# Patient Record
Sex: Female | Born: 1960
Health system: Southern US, Community
[De-identification: ages and names within clinical notes are randomized; demographics above are authoritative.]

## PROBLEM LIST (undated history)

## (undated) DIAGNOSIS — N9412 Deep dyspareunia: Secondary | ICD-10-CM

## (undated) DIAGNOSIS — R059 Cough, unspecified: Secondary | ICD-10-CM

## (undated) DIAGNOSIS — R42 Dizziness and giddiness: Secondary | ICD-10-CM

## (undated) DIAGNOSIS — G4700 Insomnia, unspecified: Secondary | ICD-10-CM

## (undated) DIAGNOSIS — R079 Chest pain, unspecified: Secondary | ICD-10-CM

## (undated) DIAGNOSIS — R05 Cough: Secondary | ICD-10-CM

## (undated) DIAGNOSIS — Z6825 Body mass index (BMI) 25.0-25.9, adult: Secondary | ICD-10-CM

## (undated) DIAGNOSIS — R002 Palpitations: Secondary | ICD-10-CM

## (undated) DIAGNOSIS — L309 Dermatitis, unspecified: Secondary | ICD-10-CM

## (undated) DIAGNOSIS — Z87891 Personal history of nicotine dependence: Secondary | ICD-10-CM

## (undated) DIAGNOSIS — R350 Frequency of micturition: Secondary | ICD-10-CM

## (undated) DIAGNOSIS — R030 Elevated blood-pressure reading, without diagnosis of hypertension: Secondary | ICD-10-CM

## (undated) DIAGNOSIS — R7989 Other specified abnormal findings of blood chemistry: Secondary | ICD-10-CM

## (undated) DIAGNOSIS — M8580 Other specified disorders of bone density and structure, unspecified site: Secondary | ICD-10-CM

## (undated) DIAGNOSIS — L509 Urticaria, unspecified: Secondary | ICD-10-CM

## (undated) DIAGNOSIS — R202 Paresthesia of skin: Secondary | ICD-10-CM

## (undated) DIAGNOSIS — M47812 Spondylosis without myelopathy or radiculopathy, cervical region: Secondary | ICD-10-CM

## (undated) HISTORY — DX: Chest pain, unspecified: R07.9

## (undated) HISTORY — PX: TONSILLECTOMY: SUR1361

## (undated) HISTORY — DX: Palpitations: R00.2

## (undated) HISTORY — DX: Other specified disorders of bone density and structure, unspecified site: M85.80

## (undated) HISTORY — DX: Insomnia, unspecified: G47.00

## (undated) HISTORY — DX: Dermatitis, unspecified: L30.9

## (undated) HISTORY — DX: Elevated blood-pressure reading, without diagnosis of hypertension: R03.0

## (undated) HISTORY — DX: Cough, unspecified: R05.9

## (undated) HISTORY — DX: Paresthesia of skin: R20.2

## (undated) HISTORY — DX: Frequency of micturition: R35.0

## (undated) HISTORY — DX: Body mass index (BMI) 25.0-25.9, adult: Z68.25

## (undated) HISTORY — DX: Personal history of nicotine dependence: Z87.891

## (undated) HISTORY — DX: Dizziness and giddiness: R42

## (undated) HISTORY — DX: Urticaria, unspecified: L50.9

## (undated) HISTORY — DX: Deep dyspareunia: N94.12

## (undated) HISTORY — DX: Other specified abnormal findings of blood chemistry: R79.89

## (undated) HISTORY — DX: Cough: R05

## (undated) HISTORY — DX: Spondylosis without myelopathy or radiculopathy, cervical region: M47.812

---

## 1999-09-02 ENCOUNTER — Other Ambulatory Visit: Admission: RE | Admit: 1999-09-02 | Discharge: 1999-09-02 | Payer: Self-pay | Admitting: Obstetrics and Gynecology

## 2000-11-14 ENCOUNTER — Other Ambulatory Visit: Admission: RE | Admit: 2000-11-14 | Discharge: 2000-11-14 | Payer: Self-pay | Admitting: Obstetrics and Gynecology

## 2002-04-08 ENCOUNTER — Other Ambulatory Visit: Admission: RE | Admit: 2002-04-08 | Discharge: 2002-04-08 | Payer: Self-pay | Admitting: Obstetrics and Gynecology

## 2003-07-23 ENCOUNTER — Other Ambulatory Visit: Admission: RE | Admit: 2003-07-23 | Discharge: 2003-07-23 | Payer: Self-pay | Admitting: Obstetrics and Gynecology

## 2004-10-10 ENCOUNTER — Other Ambulatory Visit: Admission: RE | Admit: 2004-10-10 | Discharge: 2004-10-10 | Payer: Self-pay | Admitting: Obstetrics and Gynecology

## 2005-09-13 ENCOUNTER — Encounter: Admission: RE | Admit: 2005-09-13 | Discharge: 2005-09-13 | Payer: Self-pay | Admitting: Family Medicine

## 2005-11-06 ENCOUNTER — Other Ambulatory Visit: Admission: RE | Admit: 2005-11-06 | Discharge: 2005-11-06 | Payer: Self-pay | Admitting: Obstetrics and Gynecology

## 2006-09-04 ENCOUNTER — Ambulatory Visit: Payer: Self-pay | Admitting: Family Medicine

## 2006-10-10 ENCOUNTER — Ambulatory Visit: Payer: Self-pay | Admitting: Family Medicine

## 2007-02-14 ENCOUNTER — Ambulatory Visit: Payer: Self-pay | Admitting: Family Medicine

## 2007-03-25 ENCOUNTER — Encounter: Admission: RE | Admit: 2007-03-25 | Discharge: 2007-03-25 | Payer: Self-pay | Admitting: Obstetrics and Gynecology

## 2008-08-05 ENCOUNTER — Ambulatory Visit: Payer: Self-pay | Admitting: Family Medicine

## 2008-08-21 ENCOUNTER — Ambulatory Visit: Payer: Self-pay | Admitting: Family Medicine

## 2009-02-19 ENCOUNTER — Emergency Department (HOSPITAL_COMMUNITY): Admission: EM | Admit: 2009-02-19 | Discharge: 2009-02-20 | Payer: Self-pay | Admitting: Emergency Medicine

## 2011-01-10 LAB — BASIC METABOLIC PANEL
BUN: 16 mg/dL (ref 6–23)
CO2: 26 mEq/L (ref 19–32)
Chloride: 108 mEq/L (ref 96–112)
Glucose, Bld: 106 mg/dL — ABNORMAL HIGH (ref 70–99)
Potassium: 4.5 mEq/L (ref 3.5–5.1)

## 2011-01-10 LAB — DIFFERENTIAL
Basophils Absolute: 0.1 10*3/uL (ref 0.0–0.1)
Eosinophils Absolute: 0.5 10*3/uL (ref 0.0–0.7)
Eosinophils Relative: 7 % — ABNORMAL HIGH (ref 0–5)
Monocytes Absolute: 0.7 10*3/uL (ref 0.1–1.0)

## 2011-01-10 LAB — CBC
HCT: 37.4 % (ref 36.0–46.0)
Hemoglobin: 13 g/dL (ref 12.0–15.0)
MCHC: 34.6 g/dL (ref 30.0–36.0)
MCV: 90.9 fL (ref 78.0–100.0)
Platelets: 197 10*3/uL (ref 150–400)
RDW: 13.1 % (ref 11.5–15.5)

## 2011-01-10 LAB — D-DIMER, QUANTITATIVE: D-Dimer, Quant: 0.23 ug/mL-FEU (ref 0.00–0.48)

## 2011-01-10 LAB — POCT CARDIAC MARKERS

## 2013-02-03 ENCOUNTER — Other Ambulatory Visit: Payer: Self-pay | Admitting: Family Medicine

## 2013-02-03 DIAGNOSIS — M542 Cervicalgia: Secondary | ICD-10-CM

## 2013-02-04 ENCOUNTER — Ambulatory Visit: Payer: BC Managed Care – PPO | Admitting: Diagnostic Neuroimaging

## 2013-02-05 ENCOUNTER — Ambulatory Visit
Admission: RE | Admit: 2013-02-05 | Discharge: 2013-02-05 | Disposition: A | Discharge status: Home / Self Care | Payer: BC Managed Care – PPO | Source: Ambulatory Visit | Attending: Family Medicine | Admitting: Family Medicine

## 2013-02-05 DIAGNOSIS — M542 Cervicalgia: Secondary | ICD-10-CM

## 2013-02-11 ENCOUNTER — Ambulatory Visit: Payer: BC Managed Care – PPO | Admitting: Diagnostic Neuroimaging

## 2014-01-19 ENCOUNTER — Other Ambulatory Visit: Payer: Self-pay | Admitting: Gastroenterology

## 2014-01-19 DIAGNOSIS — R1310 Dysphagia, unspecified: Secondary | ICD-10-CM

## 2014-01-22 ENCOUNTER — Ambulatory Visit
Admission: RE | Admit: 2014-01-22 | Discharge: 2014-01-22 | Disposition: A | Discharge status: Home / Self Care | Payer: BC Managed Care – PPO | Source: Ambulatory Visit | Attending: Gastroenterology | Admitting: Gastroenterology

## 2014-01-22 DIAGNOSIS — R1310 Dysphagia, unspecified: Secondary | ICD-10-CM

## 2016-02-29 DIAGNOSIS — H20022 Recurrent acute iridocyclitis, left eye: Secondary | ICD-10-CM | POA: Diagnosis not present

## 2016-03-31 ENCOUNTER — Encounter: Payer: Self-pay | Admitting: Family

## 2016-03-31 ENCOUNTER — Ambulatory Visit (INDEPENDENT_AMBULATORY_CARE_PROVIDER_SITE_OTHER): Payer: BLUE CROSS/BLUE SHIELD | Admitting: Family

## 2016-03-31 VITALS — BP 124/84 | HR 66 | Temp 98.1°F | Resp 16 | Ht 61.0 in | Wt 137.0 lb

## 2016-03-31 DIAGNOSIS — M25561 Pain in right knee: Secondary | ICD-10-CM

## 2016-03-31 DIAGNOSIS — M542 Cervicalgia: Secondary | ICD-10-CM | POA: Diagnosis not present

## 2016-03-31 MED ORDER — METHYLPREDNISOLONE ACETATE 40 MG/ML IJ SUSP
40.0000 mg | Freq: Once | INTRAMUSCULAR | Status: AC
  Administered 2016-03-31: 40 mg via INTRAMUSCULAR

## 2016-03-31 MED ORDER — KETOROLAC TROMETHAMINE 30 MG/ML IJ SOLN
30.0000 mg | Freq: Once | INTRAMUSCULAR | Status: AC
Start: 2016-03-31 — End: 2016-03-31
  Administered 2016-03-31: 30 mg via INTRAMUSCULAR

## 2016-03-31 MED ORDER — TIZANIDINE HCL 4 MG PO TABS: 4.0000 mg | ORAL_TABLET | Freq: Four times a day (QID) | ORAL | Status: DC | PRN

## 2016-03-31 MED ORDER — KETOROLAC TROMETHAMINE 30 MG/ML IJ SOLN
30.0000 mg | Freq: Once | INTRAMUSCULAR | Status: AC
  Administered 2016-03-31: 30 mg via INTRAMUSCULAR

## 2016-03-31 MED ORDER — NAPROXEN-ESOMEPRAZOLE 500-20 MG PO TBEC: 1.0000 | DELAYED_RELEASE_TABLET | Freq: Two times a day (BID) | ORAL | Status: DC | PRN

## 2016-03-31 MED ORDER — DICLOFENAC SODIUM 2 % TD SOLN: 1.0000 "application " | Freq: Two times a day (BID) | TRANSDERMAL | Status: DC | PRN

## 2016-03-31 NOTE — Progress Notes (Signed)
Subjective:    Patient ID: Veronica Booth, female    DOB: 1961/06/09, 55 y.o.   MRN: [id number]  Chief Complaint  Patient presents with  . Back Pain    has a bulging disc in her neck that is pressing on a nerve, had this issue for several years, has pain that runs from her neck all the way down her back, also having some pain and burning in her right knee     HPI:  Veronica Booth is a 55 y.o. female who  has no past medical history on file. and presents today for a Sports Medicine visit.  Neck and back pain - Previously diagnosed with cervical pain with MRI showing mild degenerative disease without central canal stensosis. There was moderate formainal narrowing at C4-C5 being worse on the right.Notes that in the last couple of months there is increased burning sensation that waxes and wanes. There is radiculopathy down the right arm with associated weakness in the right hand with decreased gripping at times  Modifying factors include anti-inflammatories, muscle relaxors and Lyrica which have helped to maintain her symptoms. Denies any trauma.  2.) Right knee pain - Associated symptom of burning pain located in her right knee has been waxing and waning for about 6 months. Denies any trauma. Previous noted x-ray of right knee with degenerative changes. Denies sounds/sensations heard or felt. No limited range of motion or changes to gait. There no modifying factors. No previous history of injury to the knee.   No Known Allergies   No outpatient prescriptions prior to visit.   No facility-administered medications prior to visit.     History reviewed. No pertinent past medical history.   Past Surgical History  Procedure Laterality Date  . Cesarean section       History reviewed. No pertinent family history.   Social History   Social History  . Marital Status: Married    Spouse Name: N/A  . Number of Children: N/A  . Years of Education: N/A   Occupational History  .  Not on file.   Social History Main Topics  . Smoking status: Former Research scientist (life sciences)  . Smokeless tobacco: Never Used  . Alcohol Use: No  . Drug Use: No  . Sexual Activity: Not on file   Other Topics Concern  . Not on file   Social History Narrative  . No narrative on file     Review of Systems  Constitutional: Negative for fever and chills.  Musculoskeletal: Positive for back pain, neck pain and neck stiffness.       Positive for right knee pain.  Neurological: Positive for weakness. Negative for numbness.      Objective:    BP 124/84 mmHg  Pulse 66  Temp(Src) 98.1 F (36.7 C) (Oral)  Resp 16  Ht 5\' 1"  (1.549 m)  Wt 137 lb (62.143 kg)  BMI 25.90 kg/m2  SpO2 96% Nursing note and vital signs reviewed.  Physical Exam  Constitutional: She is oriented to person, place, and time. She appears well-developed and well-nourished. No distress.  Neck:  Postural imbalance noted with no obvious discoloration or edema. Significant muscle spasm present on left upper trapezius. Range of motion is restricted in lateral bending bilaterally greater on the right than on the left. Cervical compression test is negative. Distal pulses and sensation are intact and appropriate in the right upper extremity.  Cardiovascular: Normal rate, regular rhythm, normal heart sounds and intact distal pulses.   Pulmonary/Chest: Effort normal  and breath sounds normal.  Musculoskeletal:  Right knee - no obvious deformity, discoloration, or edema noted. Palpable tenderness along bilateral joint lines with no other tenderness noted. Range of motion is within normal limits. Strength is 5+. Ligamentous and meniscal testing are negative. Distal pulses and sensation are intact and appropriate.  Neurological: She is alert and oriented to person, place, and time.  Skin: Skin is warm and dry.  Psychiatric: She has a normal mood and affect. Her behavior is normal. Judgment and thought content normal.       Assessment & Plan:    Problem List Items Addressed This Visit      Other   Cervical pain (neck) - Primary    Cervical neck pain most likely related to posture imbalances and previous MRI with moderate foraminal narrowing. Obtain cervical x-rays for alignment. In office injection of Depo-Medrol and Toradol provided. Start Pennsaid, Vimovo and tizanidine. Home exercise therapy and ice/heating regimen provided. Referral to physical therapy placed for postural exercises. Follow up in 3 weeks or sooner if needed.       Relevant Medications   Diclofenac Sodium (PENNSAID) 2 % SOLN   Naproxen-Esomeprazole 500-20 MG TBEC   ketorolac (TORADOL) 30 MG/ML injection 30 mg (Completed)   ketorolac (TORADOL) 30 MG/ML injection 30 mg (Completed)   methylPREDNISolone acetate (DEPO-MEDROL) injection 40 mg (Completed)   methylPREDNISolone acetate (DEPO-MEDROL) injection 40 mg (Completed)   Other Relevant Orders   DG Cervical Spine Complete   Ambulatory referral to Physical Therapy   Right knee pain    Knee with generalized tenderness and burning sensation in no ligamentous or meniscal pathology present upon exam. Question possible relation to low back pain versus actual knee pain. Treat conservatively with ice, Pennsaid, and Vimovo. If symptoms worsen or do not improve consider cortisone injection.       Relevant Medications   Diclofenac Sodium (PENNSAID) 2 % SOLN   Naproxen-Esomeprazole 500-20 MG TBEC   ketorolac (TORADOL) 30 MG/ML injection 30 mg (Completed)   ketorolac (TORADOL) 30 MG/ML injection 30 mg (Completed)   methylPREDNISolone acetate (DEPO-MEDROL) injection 40 mg (Completed)   methylPREDNISolone acetate (DEPO-MEDROL) injection 40 mg (Completed)       I am having Ms. Latka start on Diclofenac Sodium, Naproxen-Esomeprazole, and tiZANidine. We administered ketorolac, ketorolac, methylPREDNISolone acetate, and methylPREDNISolone acetate.   Meds ordered this encounter  Medications  . Diclofenac Sodium  (PENNSAID) 2 % SOLN    Sig: Place 1 application onto the skin 2 (two) times daily as needed.    Dispense:  112 g    Refill:  1    Order Specific Question:  Supervising Provider    Answer:  Pricilla Holm A [id number]  . Naproxen-Esomeprazole 500-20 MG TBEC    Sig: Take 1 tablet by mouth 2 (two) times daily as needed.    Dispense:  60 tablet    Refill:  0    Order Specific Question:  Supervising Provider    Answer:  Pricilla Holm A [id number]  . ketorolac (TORADOL) 30 MG/ML injection 30 mg    Sig:   . ketorolac (TORADOL) 30 MG/ML injection 30 mg    Sig:   . methylPREDNISolone acetate (DEPO-MEDROL) injection 40 mg    Sig:   . methylPREDNISolone acetate (DEPO-MEDROL) injection 40 mg    Sig:   . tiZANidine (ZANAFLEX) 4 MG tablet    Sig: Take 1-2 tablets (4-8 mg total) by mouth every 6 (six) hours as needed for muscle spasms.    Dispense:  40 tablet    Refill:  0     Follow-up: Return if symptoms worsen or fail to improve.  Mauricio Po, FNP

## 2016-03-31 NOTE — Patient Instructions (Addendum)
Thank you for choosing [facility].  Summary/Instructions:  Alpa-lipoic acid 600 mg daily  B12 1000 mcg daily  Ice 20 minutes every 2 hours as needed for discomfort.   Vimovo - 2x daily  Pennsaid - Knee and neck 1/2 packet to each area 2x daily.  They will call to schedule your physical therapy.    Your prescription(s) have been submitted to your pharmacy or been printed and provided for you. Please take as directed and contact our office if you believe you are having problem(s) with the medication(s) or have any questions.  Please stop by radiology on the basement level of the building for your x-rays. Your results will be released to [facility] (or called to you) after review, usually within 72 hours after test completion. If any treatments or changes are necessary, you will be notified at that same time.  Referrals have been made during this visit. You should expect to hear back from our schedulers in about 7-10 days in regards to establishing an appointment with the specialists we discussed.   If your symptoms worsen or fail to improve, please contact our office for further instruction, or in case of emergency go directly to the emergency room at the closest medical facility.   Cervical Strain and Sprain With Rehab Cervical strain and sprain are injuries that commonly occur with "whiplash" injuries. Whiplash occurs when the neck is forcefully whipped backward or forward, such as during a motor vehicle accident or during contact sports. The muscles, ligaments, tendons, discs, and nerves of the neck are susceptible to injury when this occurs. RISK FACTORS Risk of having a whiplash injury increases if:  Osteoarthritis of the spine.  Situations that make head or neck accidents or trauma more likely.  High-risk sports (football, rugby, wrestling, hockey, auto racing, gymnastics, diving, contact karate, or boxing).  Poor strength and flexibility of the neck.  Previous neck  injury.  Poor tackling technique.  Improperly fitted or padded equipment. SYMPTOMS   Pain or stiffness in the front or back of neck or both.  Symptoms may present immediately or up to 24 hours after injury.  Dizziness, headache, nausea, and vomiting.  Muscle spasm with soreness and stiffness in the neck.  Tenderness and swelling at the injury site. PREVENTION  Learn and use proper technique (avoid tackling with the head, spearing, and head-butting; use proper falling techniques to avoid landing on the head).  Warm up and stretch properly before activity.  Maintain physical fitness:  Strength, flexibility, and endurance.  Cardiovascular fitness.  Wear properly fitted and padded protective equipment, such as padded soft collars, for participation in contact sports. PROGNOSIS  Recovery from cervical strain and sprain injuries is dependent on the extent of the injury. These injuries are usually curable in 1 week to 3 months with appropriate treatment.  RELATED COMPLICATIONS   Temporary numbness and weakness may occur if the nerve roots are damaged, and this may persist until the nerve has completely healed.  Chronic pain due to frequent recurrence of symptoms.  Prolonged healing, especially if activity is resumed too soon (before complete recovery). TREATMENT  Treatment initially involves the use of ice and medication to help reduce pain and inflammation. It is also important to perform strengthening and stretching exercises and modify activities that worsen symptoms so the injury does not get worse. These exercises may be performed at home or with a therapist. For patients who experience severe symptoms, a soft, padded collar may be recommended to be worn around the  neck.  Improving your posture may help reduce symptoms. Posture improvement includes pulling your chin and abdomen in while sitting or standing. If you are sitting, sit in a firm chair with your buttocks against the  back of the chair. While sleeping, try replacing your pillow with a small towel rolled to 2 inches in diameter, or use a cervical pillow or soft cervical collar. Poor sleeping positions delay healing.  For patients with nerve root damage, which causes numbness or weakness, the use of a cervical traction apparatus may be recommended. Surgery is rarely necessary for these injuries. However, cervical strain and sprains that are present at birth (congenital) may require surgery. MEDICATION   If pain medication is necessary, nonsteroidal anti-inflammatory medications, such as aspirin and ibuprofen, or other minor pain relievers, such as acetaminophen, are often recommended.  Do not take pain medication for 7 days before surgery.  Prescription pain relievers may be given if deemed necessary by your caregiver. Use only as directed and only as much as you need. HEAT AND COLD:   Cold treatment (icing) relieves pain and reduces inflammation. Cold treatment should be applied for 10 to 15 minutes every 2 to 3 hours for inflammation and pain and immediately after any activity that aggravates your symptoms. Use ice packs or an ice massage.  Heat treatment may be used prior to performing the stretching and strengthening activities prescribed by your caregiver, physical therapist, or athletic trainer. Use a heat pack or a warm soak. SEEK MEDICAL CARE IF:   Symptoms get worse or do not improve in 2 weeks despite treatment.  New, unexplained symptoms develop (drugs used in treatment may produce side effects). EXERCISES RANGE OF MOTION (ROM) AND STRETCHING EXERCISES - Cervical Strain and Sprain These exercises may help you when beginning to rehabilitate your injury. In order to successfully resolve your symptoms, you must improve your posture. These exercises are designed to help reduce the forward-head and rounded-shoulder posture which contributes to this condition. Your symptoms may resolve with or without  further involvement from your physician, physical therapist or athletic trainer. While completing these exercises, remember:   Restoring tissue flexibility helps normal motion to return to the joints. This allows healthier, less painful movement and activity.  An effective stretch should be held for at least 20 seconds, although you may need to begin with shorter hold times for comfort.  A stretch should never be painful. You should only feel a gentle lengthening or release in the stretched tissue. STRETCH- Axial Extensors  Lie on your back on the floor. You may bend your knees for comfort. Place a rolled-up hand towel or dish towel, about 2 inches in diameter, under the part of your head that makes contact with the floor.  Gently tuck your chin, as if trying to make a "double chin," until you feel a gentle stretch at the base of your head.  Hold __________ seconds. Repeat __________ times. Complete this exercise __________ times per day.  STRETCH - Axial Extension   Stand or sit on a firm surface. Assume a good posture: chest up, shoulders drawn back, abdominal muscles slightly tense, knees unlocked (if standing) and feet hip width apart.  Slowly retract your chin so your head slides back and your chin slightly lowers. Continue to look straight ahead.  You should feel a gentle stretch in the back of your head. Be certain not to feel an aggressive stretch since this can cause headaches later.  Hold for __________ seconds. Repeat __________ times.  Complete this exercise __________ times per day. STRETCH - Cervical Side Bend   Stand or sit on a firm surface. Assume a good posture: chest up, shoulders drawn back, abdominal muscles slightly tense, knees unlocked (if standing) and feet hip width apart.  Without letting your nose or shoulders move, slowly tip your right / left ear to your shoulder until your feel a gentle stretch in the muscles on the opposite side of your neck.  Hold  __________ seconds. Repeat __________ times. Complete this exercise __________ times per day. STRETCH - Cervical Rotators   Stand or sit on a firm surface. Assume a good posture: chest up, shoulders drawn back, abdominal muscles slightly tense, knees unlocked (if standing) and feet hip width apart.  Keeping your eyes level with the ground, slowly turn your head until you feel a gentle stretch along the back and opposite side of your neck.  Hold __________ seconds. Repeat __________ times. Complete this exercise __________ times per day. RANGE OF MOTION - Neck Circles   Stand or sit on a firm surface. Assume a good posture: chest up, shoulders drawn back, abdominal muscles slightly tense, knees unlocked (if standing) and feet hip width apart.  Gently roll your head down and around from the back of one shoulder to the back of the other. The motion should never be forced or painful.  Repeat the motion 10-20 times, or until you feel the neck muscles relax and loosen. Repeat __________ times. Complete the exercise __________ times per day. STRENGTHENING EXERCISES - Cervical Strain and Sprain These exercises may help you when beginning to rehabilitate your injury. They may resolve your symptoms with or without further involvement from your physician, physical therapist, or athletic trainer. While completing these exercises, remember:   Muscles can gain both the endurance and the strength needed for everyday activities through controlled exercises.  Complete these exercises as instructed by your physician, physical therapist, or athletic trainer. Progress the resistance and repetitions only as guided.  You may experience muscle soreness or fatigue, but the pain or discomfort you are trying to eliminate should never worsen during these exercises. If this pain does worsen, stop and make certain you are following the directions exactly. If the pain is still present after adjustments, discontinue the  exercise until you can discuss the trouble with your clinician. STRENGTH - Cervical Flexors, Isometric  Face a wall, standing about 6 inches away. Place a small pillow, a ball about 6-8 inches in diameter, or a folded towel between your forehead and the wall.  Slightly tuck your chin and gently push your forehead into the soft object. Push only with mild to moderate intensity, building up tension gradually. Keep your jaw and forehead relaxed.  Hold 10 to 20 seconds. Keep your breathing relaxed.  Release the tension slowly. Relax your neck muscles completely before you start the next repetition. Repeat __________ times. Complete this exercise __________ times per day. STRENGTH- Cervical Lateral Flexors, Isometric   Stand about 6 inches away from a wall. Place a small pillow, a ball about 6-8 inches in diameter, or a folded towel between the side of your head and the wall.  Slightly tuck your chin and gently tilt your head into the soft object. Push only with mild to moderate intensity, building up tension gradually. Keep your jaw and forehead relaxed.  Hold 10 to 20 seconds. Keep your breathing relaxed.  Release the tension slowly. Relax your neck muscles completely before you start the next repetition. Repeat __________  times. Complete this exercise __________ times per day. STRENGTH - Cervical Extensors, Isometric   Stand about 6 inches away from a wall. Place a small pillow, a ball about 6-8 inches in diameter, or a folded towel between the back of your head and the wall.  Slightly tuck your chin and gently tilt your head back into the soft object. Push only with mild to moderate intensity, building up tension gradually. Keep your jaw and forehead relaxed.  Hold 10 to 20 seconds. Keep your breathing relaxed.  Release the tension slowly. Relax your neck muscles completely before you start the next repetition. Repeat __________ times. Complete this exercise __________ times per  day. POSTURE AND BODY MECHANICS CONSIDERATIONS - Cervical Strain and Sprain Keeping correct posture when sitting, standing or completing your activities will reduce the stress put on different body tissues, allowing injured tissues a chance to heal and limiting painful experiences. The following are general guidelines for improved posture. Your physician or physical therapist will provide you with any instructions specific to your needs. While reading these guidelines, remember:  The exercises prescribed by your provider will help you have the flexibility and strength to maintain correct postures.  The correct posture provides the optimal environment for your joints to work. All of your joints have less wear and tear when properly supported by a spine with good posture. This means you will experience a healthier, less painful body.  Correct posture must be practiced with all of your activities, especially prolonged sitting and standing. Correct posture is as important when doing repetitive low-stress activities (typing) as it is when doing a single heavy-load activity (lifting). PROLONGED STANDING WHILE SLIGHTLY LEANING FORWARD When completing a task that requires you to lean forward while standing in one place for a long time, place either foot up on a stationary 2- to 4-inch high object to help maintain the best posture. When both feet are on the ground, the low back tends to lose its slight inward curve. If this curve flattens (or becomes too large), then the back and your other joints will experience too much stress, fatigue more quickly, and can cause pain.  RESTING POSITIONS Consider which positions are most painful for you when choosing a resting position. If you have pain with flexion-based activities (sitting, bending, stooping, squatting), choose a position that allows you to rest in a less flexed posture. You would want to avoid curling into a fetal position on your side. If your pain worsens  with extension-based activities (prolonged standing, working overhead), avoid resting in an extended position such as sleeping on your stomach. Most people will find more comfort when they rest with their spine in a more neutral position, neither too rounded nor too arched. Lying on a non-sagging bed on your side with a pillow between your knees, or on your back with a pillow under your knees will often provide some relief. Keep in mind, being in any one position for a prolonged period of time, no matter how correct your posture, can still lead to stiffness. WALKING Walk with an upright posture. Your ears, shoulders, and hips should all line up. OFFICE WORK When working at a desk, create an environment that supports good, upright posture. Without extra support, muscles fatigue and lead to excessive strain on joints and other tissues. CHAIR:  A chair should be able to slide under your desk when your back makes contact with the back of the chair. This allows you to work closely.  The chair's height  should allow your eyes to be level with the upper part of your monitor and your hands to be slightly lower than your elbows.  Body position:  Your feet should make contact with the floor. If this is not possible, use a foot rest.  Keep your ears over your shoulders. This will reduce stress on your neck and low back.   This information is not intended to replace advice given to you by your health care provider. Make sure you discuss any questions you have with your health care provider.   Document Released: 09/18/2005 Document Revised: 10/09/2014 Document Reviewed: 12/31/2008 Elsevier Interactive Patient Education [employer].

## 2016-03-31 NOTE — Assessment & Plan Note (Signed)
Knee with generalized tenderness and burning sensation in no ligamentous or meniscal pathology present upon exam. Question possible relation to low back pain versus actual knee pain. Treat conservatively with ice, Pennsaid, and Vimovo. If symptoms worsen or do not improve consider cortisone injection.

## 2016-03-31 NOTE — Assessment & Plan Note (Signed)
Cervical neck pain most likely related to posture imbalances and previous MRI with moderate foraminal narrowing. Obtain cervical x-rays for alignment. In office injection of Depo-Medrol and Toradol provided. Start Pennsaid, Vimovo and tizanidine. Home exercise therapy and ice/heating regimen provided. Referral to physical therapy placed for postural exercises. Follow up in 3 weeks or sooner if needed.

## 2016-04-21 ENCOUNTER — Ambulatory Visit: Payer: BLUE CROSS/BLUE SHIELD | Admitting: Family

## 2016-04-21 ENCOUNTER — Telehealth: Payer: Self-pay | Admitting: Family

## 2016-04-21 NOTE — Telephone Encounter (Signed)
Ok to schedule if she calls back. 

## 2016-04-21 NOTE — Telephone Encounter (Signed)
Patient no showed for 3 week fu.  Please advise.

## 2016-05-26 ENCOUNTER — Telehealth: Payer: Self-pay | Admitting: Family

## 2016-05-26 NOTE — Telephone Encounter (Signed)
No approval needed.

## 2016-05-26 NOTE — Telephone Encounter (Signed)
Pt called stating she got NS fee on 04/21/16. Pt stating she called the day before after 5 pm and spoke to answering services to cancel this appt. Pt was sick that day that's why she can not come. Please advise.   Pt saw Marya Amsler for sport medicine (new pt) b/c she couldn't get in with Dr. Tamala Julian. She was wondering if she can transfer from [facility] to Dr. Tamala Julian for sport medicine. Please advise, do I need to a massage request transfer for both or how to do this?

## 2016-06-07 NOTE — Telephone Encounter (Signed)
Rachel Bo has already approved voiding the NS charge and per Tyrone Apple to schedule with Dr. Tamala Julian.

## 2016-06-28 ENCOUNTER — Other Ambulatory Visit: Payer: Self-pay | Admitting: Obstetrics and Gynecology

## 2016-06-28 DIAGNOSIS — R1084 Generalized abdominal pain: Secondary | ICD-10-CM | POA: Diagnosis not present

## 2016-06-28 DIAGNOSIS — R109 Unspecified abdominal pain: Secondary | ICD-10-CM

## 2016-07-05 DIAGNOSIS — R1084 Generalized abdominal pain: Secondary | ICD-10-CM | POA: Diagnosis not present

## 2016-07-06 ENCOUNTER — Ambulatory Visit (HOSPITAL_COMMUNITY): Payer: BLUE CROSS/BLUE SHIELD

## 2016-07-13 NOTE — Progress Notes (Signed)
[facility] [place],  [address] Phone: [phone] Subjective:    I'm seeing this patient by the request  of:  Tawanna Solo, MD   CC: Neck and back pain   [id number]  Veronica Booth is a 55 y.o. female coming in with complaint of Neck pain. Patient is a past medical history significant for mild degenerative disc disease of the cervical spine. Patient does have an MRI. MRI was independently visualized by me showing some mild foraminal narrowing at C4-C5 mostly on the right side. No true stenosis noted. Patient was last seen by another provider in June for this. Seem to have more intermittent pain. Does have radicular symptoms going down the right arm. Symptoms states that she has weakness on the right side. Has tried muscle relaxers as well as Lyrica has helped her symptoms. Patient states Pain seems to be more on the shoulders and the neck itself the moment. Seems more on the left side. Patient states that it is so severe that is continuing to wake her up at night and affect daily activities.     No past medical history on file. Past Surgical History:  Procedure Laterality Date  . CESAREAN SECTION     Social History   Social History  . Marital status: Married    Spouse name: N/A  . Number of children: N/A  . Years of education: N/A   Social History Main Topics  . Smoking status: Former Research scientist (life sciences)  . Smokeless tobacco: Never Used  . Alcohol use No  . Drug use: No  . Sexual activity: Not Asked   Other Topics Concern  . None   Social History Narrative  . None   No Known Allergies No family history on file.  Past medical history, social, surgical and family history all reviewed in electronic medical record.  No pertanent information unless stated regarding to the chief complaint.   Review of Systems: No headache, visual changes, nausea, vomiting, diarrhea, constipation, dizziness, abdominal pain, skin rash, fevers,  chills, night sweats, weight loss, swollen lymph nodes,, chest pain, shortness of breath, mood changes.   Objective  Blood pressure 126/72, pulse 76, weight 139 lb (63 kg), SpO2 97 %.  General: No apparent distress alert and oriented x3 mood and affect normal, dressed appropriately.  HEENT: Pupils equal, extraocular movements intact  Respiratory: Patient's speak in full sentences and does not appear short of breath  Cardiovascular: No lower extremity edema, non tender, no erythema  Skin: Warm dry intact with no signs of infection or rash on extremities or on axial skeleton.  Abdomen: Soft nontender  Neuro: Cranial nerves II through XII are intact, neurovascularly intact in all extremities with 2+ DTRs and 2+ pulses.  Lymph: No lymphadenopathy of posterior or anterior cervical chain or axillae bilaterally.  Gait normal with good balance and coordination.  MSK:  Non tender with full range of motion and good stability and symmetric strength and tone of shoulders, elbows, wrist, hip, knee and ankles bilaterally.  Neck: Inspection unremarkable. No palpable stepoffs. Negative Spurling's maneuver. Mild limitation lacking the last 5 of extension and flexion. Grip strength and sensation normal in bilateral hands Strength good C4 to T1 distribution No sensory change to C4 to T1 Negative Hoffman sign bilaterally Reflexes normal Severe muscle spasm with multiple trigger points in the left trapezius muscle.  After verbal consent patient was prepped with alcohol swabs and with a 25-gauge half-inch needle was injected with a total  of 3 mL of 0.5% Marcaine and 1 mL of Kenalog 40 mg/dL. This was the total amount in for different trigger points. Patient did have some improvement in pain. No blood loss. Post injection instructions given.    Impression and Recommendations:     This case required medical decision making of moderate complexity.      Note: This dictation was prepared with Dragon  dictation along with smaller phrase technology. Any transcriptional errors that result from this process are unintentional.

## 2016-07-14 ENCOUNTER — Ambulatory Visit (INDEPENDENT_AMBULATORY_CARE_PROVIDER_SITE_OTHER): Payer: BLUE CROSS/BLUE SHIELD | Admitting: Family Medicine

## 2016-07-14 ENCOUNTER — Encounter: Payer: Self-pay | Admitting: Family Medicine

## 2016-07-14 DIAGNOSIS — M5010 Cervical disc disorder with radiculopathy, unspecified cervical region: Secondary | ICD-10-CM | POA: Diagnosis not present

## 2016-07-14 DIAGNOSIS — R293 Abnormal posture: Secondary | ICD-10-CM

## 2016-07-14 DIAGNOSIS — M25512 Pain in left shoulder: Secondary | ICD-10-CM | POA: Insufficient documentation

## 2016-07-14 MED ORDER — PREDNISONE 50 MG PO TABS: 50.0000 mg | ORAL_TABLET | Freq: Every day | ORAL | 0 refills | Status: DC

## 2016-07-14 MED ORDER — GABAPENTIN 100 MG PO CAPS: 200.0000 mg | ORAL_CAPSULE | Freq: Every day | ORAL | 3 refills | Status: DC

## 2016-07-14 NOTE — Patient Instructions (Signed)
Good to see you  Ice 20 minutes 2 times daily. Usually after activity and before bed. Exercises 3 times a week.  On wall with heels, butt shoulder and head touching for a goal of 5 minutes daily  Trigger point injections given today  Gabapentin 200mg  at night prednsione daily for 5 days After the prednsione then get turmeric 500mg  twice daily to help with inflammation  See me again in 2-3 weeks.

## 2016-07-14 NOTE — Assessment & Plan Note (Signed)
Mild radicular symptoms down the right side. Negative Spurling's today. Tightness of the neck.

## 2016-07-14 NOTE — Assessment & Plan Note (Signed)
Patient was given general points in the left shoulder today. Tolerated the procedure well. Started on gabapentin as well as prednisone. Seems to be more of a muscle spasm. Does have muscle relaxer for any breakthrough pain. We discussed icing regimen. Discussed posture and ergonomics. Work with Product/process development scientist to help this as well. Follow-up again in 2-3 weeks

## 2016-07-14 NOTE — Assessment & Plan Note (Signed)
I do believe that this can be contributing as well. Patient states that she has had some other health issues recently. Discussed that if worsening symptoms we may need to repeat imaging of the neck. Patient having mild radiation anything that the gabapentin will be helpful. Follow-up again in 2-3 weeks

## 2016-07-15 DIAGNOSIS — K5904 Chronic idiopathic constipation: Secondary | ICD-10-CM | POA: Diagnosis not present

## 2016-07-15 DIAGNOSIS — R5383 Other fatigue: Secondary | ICD-10-CM | POA: Diagnosis not present

## 2016-07-15 DIAGNOSIS — R109 Unspecified abdominal pain: Secondary | ICD-10-CM | POA: Diagnosis not present

## 2016-07-15 DIAGNOSIS — K219 Gastro-esophageal reflux disease without esophagitis: Secondary | ICD-10-CM | POA: Diagnosis not present

## 2016-07-15 DIAGNOSIS — R110 Nausea: Secondary | ICD-10-CM | POA: Diagnosis not present

## 2016-07-20 DIAGNOSIS — H5213 Myopia, bilateral: Secondary | ICD-10-CM | POA: Diagnosis not present

## 2016-07-27 ENCOUNTER — Other Ambulatory Visit (HOSPITAL_COMMUNITY): Payer: Self-pay | Admitting: Gastroenterology

## 2016-07-27 DIAGNOSIS — R1013 Epigastric pain: Secondary | ICD-10-CM | POA: Diagnosis not present

## 2016-07-27 DIAGNOSIS — R1011 Right upper quadrant pain: Secondary | ICD-10-CM

## 2016-07-27 DIAGNOSIS — K5904 Chronic idiopathic constipation: Secondary | ICD-10-CM | POA: Diagnosis not present

## 2016-07-27 DIAGNOSIS — K219 Gastro-esophageal reflux disease without esophagitis: Secondary | ICD-10-CM | POA: Diagnosis not present

## 2016-07-27 DIAGNOSIS — R110 Nausea: Secondary | ICD-10-CM

## 2016-07-30 DIAGNOSIS — Z23 Encounter for immunization: Secondary | ICD-10-CM | POA: Diagnosis not present

## 2016-07-30 DIAGNOSIS — Z114 Encounter for screening for human immunodeficiency virus [HIV]: Secondary | ICD-10-CM | POA: Diagnosis not present

## 2016-07-30 DIAGNOSIS — R252 Cramp and spasm: Secondary | ICD-10-CM | POA: Diagnosis not present

## 2016-07-30 DIAGNOSIS — Z0000 Encounter for general adult medical examination without abnormal findings: Secondary | ICD-10-CM | POA: Diagnosis not present

## 2016-07-30 DIAGNOSIS — Z780 Asymptomatic menopausal state: Secondary | ICD-10-CM | POA: Diagnosis not present

## 2016-07-30 DIAGNOSIS — E559 Vitamin D deficiency, unspecified: Secondary | ICD-10-CM | POA: Diagnosis not present

## 2016-07-30 DIAGNOSIS — R0602 Shortness of breath: Secondary | ICD-10-CM | POA: Diagnosis not present

## 2016-08-03 NOTE — Progress Notes (Signed)
[facility] [place], [place] [address] Phone: [phone] Subjective:    I'm seeing this patient by the request  of:  No primary care provider on file.   CC: Neck and back pain Follow-up  [id number]  Veronica Booth is a 55 y.o. female coming in with complaint of Neck pain. Patient is a past medical history significant for mild degenerative disc disease of the cervical spine. Patient does have an MRI. MRI was independently visualized by me showing some mild foraminal narrowing at C4-C5 mostly on the right side. No true stenosis noted. This is from 2014.  Patient hadn't seen me recently and did have trigger point injections. Patient given gabapentin to take at night for any cervical radiculopathy that could be contributing as well as a muscle relaxer. Patient given a 5 day course of prednisone. This is 3 weeks ago. Patient states prednisone did help. Mild radiation. No weakness, still affecting some ADL;s    No past medical history on file. Past Surgical History:  Procedure Laterality Date  . CESAREAN SECTION     Social History   Social History  . Marital status: Married    Spouse name: N/A  . Number of children: N/A  . Years of education: N/A   Social History Main Topics  . Smoking status: Former Research scientist (life sciences)  . Smokeless tobacco: Never Used  . Alcohol use No  . Drug use: No  . Sexual activity: Not Asked   Other Topics Concern  . None   Social History Narrative  . None   No Known Allergies No family history on file. No history of rheumatological diseases  Past medical history, social, surgical and family history all reviewed in electronic medical record.  No pertanent information unless stated regarding to the chief complaint.   Review of Systems: No headache, visual changes, nausea, vomiting, diarrhea, constipation, dizziness, abdominal pain, skin rash, fevers, chills, night sweats, weight loss, swollen lymph nodes,, chest  pain, shortness of breath, mood changes.   Objective  Blood pressure 118/82, pulse 68, height 5\' 1"  (1.549 m), weight 140 lb (63.5 kg), SpO2 99 %.  Systems examined below as of 08/04/16 General: NAD A&O x3 mood, affect normal  HEENT: Pupils equal, extraocular movements intact no nystagmus Respiratory: not short of breath at rest or with speaking Cardiovascular: No lower extremity edema, non tender Skin: Warm dry intact with no signs of infection or rash on extremities or on axial skeleton. Abdomen: Soft nontender, no masses Neuro: Cranial nerves  intact, neurovascularly intact in all extremities with 2+ DTRs and 2+ pulses. Lymph: No lymphadenopathy appreciated today  Gait normal with good balance and coordination.  MSK: Non tender with full range of motion and good stability and symmetric strength and tone of shoulders, elbows, wrist,  knee hips and ankles bilaterally.   Neck: Inspection unremarkable. No palpable stepoffs. Negative Spurling's maneuver. Continue mild limitation of range of motion Grip strength and sensation normal in bilateral hands Strength good C4 to T1 distribution No sensory change to C4 to T1 Negative Hoffman sign bilaterally Reflexes normal Still mild tightness of the left trapezius  Osteopathic findings C2 flexed rotated and side bent right T1 extended rotated and side bent left with elevated first rib T3 extended rotated and side bent right with inhaled third rib L1 flexed rotated and side bent right Sacrum left on left Left anterior innominate    Impression and Recommendations:     This case required medical decision  making of moderate complexity.      Note: This dictation was prepared with Dragon dictation along with smaller phrase technology. Any transcriptional errors that result from this process are unintentional.

## 2016-08-04 ENCOUNTER — Encounter: Payer: Self-pay | Admitting: Family Medicine

## 2016-08-04 ENCOUNTER — Ambulatory Visit (INDEPENDENT_AMBULATORY_CARE_PROVIDER_SITE_OTHER): Payer: BLUE CROSS/BLUE SHIELD | Admitting: Family Medicine

## 2016-08-04 DIAGNOSIS — M999 Biomechanical lesion, unspecified: Secondary | ICD-10-CM | POA: Insufficient documentation

## 2016-08-04 DIAGNOSIS — M5010 Cervical disc disorder with radiculopathy, unspecified cervical region: Secondary | ICD-10-CM | POA: Diagnosis not present

## 2016-08-04 MED ORDER — GABAPENTIN 300 MG PO CAPS: 300.0000 mg | ORAL_CAPSULE | Freq: Every day | ORAL | 1 refills | Status: DC

## 2016-08-04 NOTE — Assessment & Plan Note (Signed)
Decision today to treat with OMT was based on Physical Exam  After verbal consent patient was treated with HVLA, ME techniques in cervical, thoracic, lumbar and sacral areas  Patient tolerated the procedure well with improvement in symptoms  Patient given exercises, stretches and lifestyle modifications  See medications in patient instructions if given  Patient will follow up in 3-4 weeks  

## 2016-08-04 NOTE — Assessment & Plan Note (Signed)
Patient does have some mild osteophytic changes. Does have still the tightness in the trapezius area. Increase patient's gabapentin to 300 mg. Responded well to osteopathic manipulation. We discussed continuing posturing ergonomics. Patient will do this on a more regular basis. Patient will come back and see me again in 3-4 weeks for further evaluation and treatment.

## 2016-08-04 NOTE — Patient Instructions (Signed)
Good to see you.  Ice 20 minutes 2 times daily. Usually after activity and before bed. Heat before activity  On wall with heels, butt shoulder and head touching for a goal of 5 minutes daily  Keep up with the other exercises. Gabapentin 300mg  nightly  See me agai nin 3-4 weeks.

## 2016-08-07 DIAGNOSIS — K219 Gastro-esophageal reflux disease without esophagitis: Secondary | ICD-10-CM | POA: Diagnosis not present

## 2016-08-07 DIAGNOSIS — R1013 Epigastric pain: Secondary | ICD-10-CM | POA: Diagnosis not present

## 2016-08-07 DIAGNOSIS — R1011 Right upper quadrant pain: Secondary | ICD-10-CM | POA: Diagnosis not present

## 2016-08-08 ENCOUNTER — Encounter (HOSPITAL_COMMUNITY): Payer: BLUE CROSS/BLUE SHIELD

## 2016-08-12 DIAGNOSIS — E559 Vitamin D deficiency, unspecified: Secondary | ICD-10-CM | POA: Diagnosis not present

## 2016-08-12 DIAGNOSIS — M5412 Radiculopathy, cervical region: Secondary | ICD-10-CM | POA: Diagnosis not present

## 2016-08-12 DIAGNOSIS — M545 Low back pain: Secondary | ICD-10-CM | POA: Diagnosis not present

## 2016-08-12 DIAGNOSIS — R945 Abnormal results of liver function studies: Secondary | ICD-10-CM | POA: Diagnosis not present

## 2016-08-16 ENCOUNTER — Encounter (HOSPITAL_COMMUNITY): Payer: BLUE CROSS/BLUE SHIELD

## 2016-08-18 DIAGNOSIS — M79604 Pain in right leg: Secondary | ICD-10-CM | POA: Diagnosis not present

## 2016-08-18 DIAGNOSIS — M79605 Pain in left leg: Secondary | ICD-10-CM | POA: Diagnosis not present

## 2016-08-18 DIAGNOSIS — R0602 Shortness of breath: Secondary | ICD-10-CM | POA: Diagnosis not present

## 2016-08-23 ENCOUNTER — Encounter (HOSPITAL_COMMUNITY)
Admission: RE | Admit: 2016-08-23 | Discharge: 2016-08-23 | Disposition: A | Discharge status: Home / Self Care | Payer: BLUE CROSS/BLUE SHIELD | Source: Ambulatory Visit | Attending: Gastroenterology | Admitting: Gastroenterology

## 2016-08-23 DIAGNOSIS — R110 Nausea: Secondary | ICD-10-CM | POA: Insufficient documentation

## 2016-08-23 DIAGNOSIS — R1011 Right upper quadrant pain: Secondary | ICD-10-CM | POA: Insufficient documentation

## 2016-08-23 MED ORDER — TECHNETIUM TC 99M MEBROFENIN IV KIT
4.9000 | PACK | Freq: Once | INTRAVENOUS | Status: AC | PRN
  Administered 2016-08-23: 4.9 via INTRAVENOUS

## 2016-08-29 DIAGNOSIS — E782 Mixed hyperlipidemia: Secondary | ICD-10-CM | POA: Diagnosis not present

## 2016-08-29 DIAGNOSIS — I252 Old myocardial infarction: Secondary | ICD-10-CM | POA: Diagnosis not present

## 2016-08-31 NOTE — Progress Notes (Signed)
[facility] [name], [place] [address] Phone: [phone] Subjective:    I'm seeing this patient by the request  of:  No primary care provider on file.   CC: Neck and back pain Follow-up  [id number]  Veronica Booth is a 55 y.o. female coming in with complaint of Neck pain. Patient is a past medical history significant for mild degenerative disc disease of the cervical spine. Patient does have an MRI. MRI was independently visualized by me showing some mild foraminal narrowing at C4-C5 mostly on the right side. No true stenosis noted. This is from 2014.  Patient hadn't seen me recently and did have trigger point injections.   Since of date patient was to do home exercises. Patient was started on gabapentin 300 mg at night. We did attempt osteopathic manipulation. Patient states She is making improvement. Apparently 50% better. Still some mild tightness that started this week. Seems to be worsening again. Feels like she did have response to the manipulation. Encouraged by the progress. Having some difficulty with sitting at a desk for long amount of time.    No past medical history on file. Past Surgical History:  Procedure Laterality Date  . CESAREAN SECTION     Social History   Social History  . Marital status: Married    Spouse name: N/A  . Number of children: N/A  . Years of education: N/A   Social History Main Topics  . Smoking status: Former Research scientist (life sciences)  . Smokeless tobacco: Never Used  . Alcohol use No  . Drug use: No  . Sexual activity: Not Asked   Other Topics Concern  . None   Social History Narrative  . None   No Known Allergies No family history on file. No history of rheumatological diseases  Past medical history, social, surgical and family history all reviewed in electronic medical record.  No pertanent information unless stated regarding to the chief complaint.   Review of Systems: No headache, visual  changes, nausea, vomiting, diarrhea, constipation, dizziness, abdominal pain, skin rash, fevers, chills, night sweats, weight loss, swollen lymph nodes,, chest pain, shortness of breath, mood changes.   Objective  Blood pressure 108/72, pulse 69, height 5\' 1"  (1.549 m), weight 141 lb (64 kg), last menstrual period 08/02/2016, SpO2 98 %.  Systems examined below as of 09/01/16 General: NAD A&O x3 mood, affect normal  HEENT: Pupils equal, extraocular movements intact no nystagmus Respiratory: not short of breath at rest or with speaking Cardiovascular: No lower extremity edema, non tender Skin: Warm dry intact with no signs of infection or rash on extremities or on axial skeleton. Abdomen: Soft nontender, no masses Neuro: Cranial nerves  intact, neurovascularly intact in all extremities with 2+ DTRs and 2+ pulses. Lymph: No lymphadenopathy appreciated today  Gait normal with good balance and coordination.  MSK: Non tender with full range of motion and good stability and symmetric strength and tone of shoulders, elbows, wrist,  knee hips and ankles bilaterally.       Neck: Inspection unremarkable. No palpable stepoffs. Negative Spurling's maneuver. Continue mild limitation of range of motion Grip strength and sensation normal in bilateral hands Strength good C4 to T1 distribution No sensory change to C4 to T1 Negative Hoffman sign bilaterally Reflexes normal Still mild tightness of the left trapezius still present but moderately improved.  Osteopathic findings C2 flexed rotated and side bent right T1 extended rotated and side bent left with elevated first rib T3  extended rotated and side bent right L2 flexed rotated and side bent right Sacrum left on left Left ilium neutral    Impression and Recommendations:     This case required medical decision making of moderate complexity.      Note: This dictation was prepared with Dragon dictation along with smaller phrase technology.  Any transcriptional errors that result from this process are unintentional.

## 2016-09-01 ENCOUNTER — Encounter: Payer: Self-pay | Admitting: Family Medicine

## 2016-09-01 ENCOUNTER — Ambulatory Visit (INDEPENDENT_AMBULATORY_CARE_PROVIDER_SITE_OTHER): Payer: BLUE CROSS/BLUE SHIELD | Admitting: Family Medicine

## 2016-09-01 ENCOUNTER — Encounter: Payer: Self-pay | Admitting: *Deleted

## 2016-09-01 VITALS — BP 108/72 | HR 69 | Ht 61.0 in | Wt 141.0 lb

## 2016-09-01 DIAGNOSIS — M999 Biomechanical lesion, unspecified: Secondary | ICD-10-CM

## 2016-09-01 DIAGNOSIS — M5010 Cervical disc disorder with radiculopathy, unspecified cervical region: Secondary | ICD-10-CM | POA: Diagnosis not present

## 2016-09-01 NOTE — Assessment & Plan Note (Signed)
Patient does have some mild arthritis overall. Has responded well to manipulation. We discussed with patient to continue the core stabilization. We discussed ergonomics again in patient was given a note for work to get a standing does. Patient has muscle relaxer if needed. Encourage her to take the gabapentin regularly. Follow-up again in 3-6 weeks.

## 2016-09-01 NOTE — Patient Instructions (Addendum)
Good to see you  Happy holidays!  Ice is still good.  I would still continue to work on posture, strengthen upper back and this should take pressure off your neck.  I think you are doing well and need to give yourself some credit.  See me again in 3-6 weeks.

## 2016-09-01 NOTE — Assessment & Plan Note (Signed)
Decision today to treat with OMT was based on Physical Exam  After verbal consent patient was treated with HVLA, ME techniques in cervical, thoracic, lumbar and sacral areas  Patient tolerated the procedure well with improvement in symptoms  Patient given exercises, stretches and lifestyle modifications  See medications in patient instructions if given  Patient will follow up in 3-6 weeks

## 2016-09-06 DIAGNOSIS — R0989 Other specified symptoms and signs involving the circulatory and respiratory systems: Secondary | ICD-10-CM | POA: Diagnosis not present

## 2016-09-06 DIAGNOSIS — R9431 Abnormal electrocardiogram [ECG] [EKG]: Secondary | ICD-10-CM | POA: Diagnosis not present

## 2016-09-06 DIAGNOSIS — R0602 Shortness of breath: Secondary | ICD-10-CM | POA: Diagnosis not present

## 2016-09-08 DIAGNOSIS — J209 Acute bronchitis, unspecified: Secondary | ICD-10-CM | POA: Diagnosis not present

## 2016-09-08 DIAGNOSIS — J42 Unspecified chronic bronchitis: Secondary | ICD-10-CM | POA: Diagnosis not present

## 2016-09-08 DIAGNOSIS — K219 Gastro-esophageal reflux disease without esophagitis: Secondary | ICD-10-CM | POA: Diagnosis not present

## 2016-09-08 DIAGNOSIS — Z79899 Other long term (current) drug therapy: Secondary | ICD-10-CM | POA: Diagnosis not present

## 2016-09-08 DIAGNOSIS — R0602 Shortness of breath: Secondary | ICD-10-CM | POA: Diagnosis not present

## 2016-09-12 DIAGNOSIS — K219 Gastro-esophageal reflux disease without esophagitis: Secondary | ICD-10-CM | POA: Diagnosis not present

## 2016-09-12 DIAGNOSIS — R1012 Left upper quadrant pain: Secondary | ICD-10-CM | POA: Diagnosis not present

## 2016-10-03 NOTE — Progress Notes (Deleted)
[facility] [address] [place], [place] [address] Phone: [phone] Subjective:    I'm seeing this patient by the request  of:  No primary care provider on file.   CC: Neck and back pain Follow-up  [id number]  Veronica Booth is a 56 y.o. female coming in with complaint of Neck pain. Patient is a past medical history significant for mild degenerative disc disease of the cervical spine. Patient does have an MRI. MRI was independently visualized by me showing some mild foraminal narrowing at C4-C5 mostly on the right side. No true stenosis noted. This is from 2014.  Patient hadn't seen me recently and did have trigger point injections.   Since of date patient was to do home exercises. Patient was started on gabapentin 300 mg at night. We did attempt osteopathic manipulation. Patient states Last follow-up she is doing 50% better. Was to continue with conservative therapy. Patient states    No past medical history on file. Past Surgical History:  Procedure Laterality Date  . CESAREAN SECTION     Social History   Social History  . Marital status: Married    Spouse name: N/A  . Number of children: N/A  . Years of education: N/A   Social History Main Topics  . Smoking status: Former Research scientist (life sciences)  . Smokeless tobacco: Never Used  . Alcohol use No  . Drug use: No  . Sexual activity: Not on file   Other Topics Concern  . Not on file   Social History Narrative  . No narrative on file   No Known Allergies No family history on file. No history of rheumatological diseases  Past medical history, social, surgical and family history all reviewed in electronic medical record.  No pertanent information unless stated regarding to the chief complaint.   Review of Systems: No headache, visual changes, nausea, vomiting, diarrhea, constipation, dizziness, abdominal pain, skin rash, fevers, chills, night sweats, weight loss, swollen lymph nodes,, chest pain,  shortness of breath, mood changes.   Objective  There were no vitals taken for this visit.  Systems examined below as of 10/03/16 General: NAD A&O x3 mood, affect normal  HEENT: Pupils equal, extraocular movements intact no nystagmus Respiratory: not short of breath at rest or with speaking Cardiovascular: No lower extremity edema, non tender Skin: Warm dry intact with no signs of infection or rash on extremities or on axial skeleton. Abdomen: Soft nontender, no masses Neuro: Cranial nerves  intact, neurovascularly intact in all extremities with 2+ DTRs and 2+ pulses. Lymph: No lymphadenopathy appreciated today  Gait normal with good balance and coordination.  MSK: Non tender with full range of motion and good stability and symmetric strength and tone of shoulders, elbows, wrist,  knee hips and ankles bilaterally.       Neck: Inspection unremarkable. No palpable stepoffs. Negative Spurling's maneuver. Continue mild limitation of range of motion Grip strength and sensation normal in bilateral hands Strength good C4 to T1 distribution No sensory change to C4 to T1 Negative Hoffman sign bilaterally Reflexes normal Still mild tightness of the left trapezius still present but moderately improved.  Osteopathic findings C2 flexed rotated and side bent right T1 extended rotated and side bent left with elevated first rib T3 extended rotated and side bent right L2 flexed rotated and side bent right Sacrum left on left Left ilium neutral    Impression and Recommendations:     This case required medical decision making of moderate complexity.  Note: This dictation was prepared with Dragon dictation along with smaller phrase technology. Any transcriptional errors that result from this process are unintentional.

## 2016-10-04 ENCOUNTER — Ambulatory Visit: Payer: No Typology Code available for payment source | Admitting: Family Medicine

## 2016-10-09 ENCOUNTER — Other Ambulatory Visit: Payer: Self-pay | Admitting: Family

## 2016-10-09 DIAGNOSIS — M25561 Pain in right knee: Secondary | ICD-10-CM

## 2016-10-09 DIAGNOSIS — M542 Cervicalgia: Secondary | ICD-10-CM

## 2016-10-20 DIAGNOSIS — R030 Elevated blood-pressure reading, without diagnosis of hypertension: Secondary | ICD-10-CM | POA: Diagnosis not present

## 2016-10-20 DIAGNOSIS — J0141 Acute recurrent pansinusitis: Secondary | ICD-10-CM | POA: Diagnosis not present

## 2016-10-20 DIAGNOSIS — J060 Acute laryngopharyngitis: Secondary | ICD-10-CM | POA: Diagnosis not present

## 2016-10-20 DIAGNOSIS — J40 Bronchitis, not specified as acute or chronic: Secondary | ICD-10-CM | POA: Diagnosis not present

## 2016-10-20 DIAGNOSIS — R05 Cough: Secondary | ICD-10-CM | POA: Diagnosis not present

## 2016-10-24 NOTE — Progress Notes (Deleted)
[facility] [address] [place], [place] [address] Phone: [phone] Subjective:    I'm seeing this patient by the request  of:  No primary care provider on file.   CC: Neck and back pain Follow-up  [id number]  Veronica Booth is a 56 y.o. female coming in with complaint of Neck pain. Patient is a past medical history significant for mild degenerative disc disease of the cervical spine. Patient does have an MRI. MRI was independently visualized by me showing some mild foraminal narrowing at C4-C5 mostly on the right side. No true stenosis noted. This is from 2014.  Patient hadn't seen me recently and did have trigger point injections.   Since of date patient was to do home exercises. Patient was started on gabapentin 300 mg at night. We did attempt osteopathic manipulation. Patient is making some improvement.  Date 10/25/2016-    No past medical history on file. Past Surgical History:  Procedure Laterality Date  . CESAREAN SECTION     Social History   Social History  . Marital status: Married    Spouse name: N/A  . Number of children: N/A  . Years of education: N/A   Social History Main Topics  . Smoking status: Former Research scientist (life sciences)  . Smokeless tobacco: Never Used  . Alcohol use No  . Drug use: No  . Sexual activity: Not on file   Other Topics Concern  . Not on file   Social History Narrative  . No narrative on file   No Known Allergies No family history on file. No history of rheumatological diseases  Past medical history, social, surgical and family history all reviewed in electronic medical record.  No pertanent information unless stated regarding to the chief complaint.   Review of Systems: No headache, visual changes, nausea, vomiting, diarrhea, constipation, dizziness, abdominal pain, skin rash, fevers, chills, night sweats, weight loss, swollen lymph nodes,, chest pain, shortness of breath, mood changes.   Objective  There  were no vitals taken for this visit.  Systems examined below as of 10/24/16 General: NAD A&O x3 mood, affect normal  HEENT: Pupils equal, extraocular movements intact no nystagmus Respiratory: not short of breath at rest or with speaking Cardiovascular: No lower extremity edema, non tender Skin: Warm dry intact with no signs of infection or rash on extremities or on axial skeleton. Abdomen: Soft nontender, no masses Neuro: Cranial nerves  intact, neurovascularly intact in all extremities with 2+ DTRs and 2+ pulses. Lymph: No lymphadenopathy appreciated today  Gait normal with good balance and coordination.  MSK: Non tender with full range of motion and good stability and symmetric strength and tone of shoulders, elbows, wrist,  knee hips and ankles bilaterally.       Neck: Inspection unremarkable. No palpable stepoffs. Negative Spurling's maneuver. Continue mild limitation of range of motion Grip strength and sensation normal in bilateral hands Strength good C4 to T1 distribution No sensory change to C4 to T1 Negative Hoffman sign bilaterally Reflexes normal Still mild tightness of the left trapezius still present but moderately improved.  Osteopathic findings C2 flexed rotated and side bent right T1 extended rotated and side bent left with elevated first rib T3 extended rotated and side bent right L2 flexed rotated and side bent right Sacrum left on left Left ilium neutral    Impression and Recommendations:     This case required medical decision making of moderate complexity.      Note: This dictation was  prepared with Dragon dictation along with smaller phrase technology. Any transcriptional errors that result from this process are unintentional.

## 2016-10-25 ENCOUNTER — Ambulatory Visit: Payer: No Typology Code available for payment source | Admitting: Family Medicine

## 2016-11-16 ENCOUNTER — Telehealth: Payer: Self-pay | Admitting: *Deleted

## 2016-11-16 NOTE — Telephone Encounter (Signed)
Rec'd call from PA dept w/[facility] wanting to get clinical information faxed to [phone] on [address]. Faxed MD last ov note...Johny Chess

## 2016-11-30 ENCOUNTER — Ambulatory Visit: Payer: No Typology Code available for payment source | Admitting: Family Medicine

## 2016-11-30 ENCOUNTER — Ambulatory Visit (INDEPENDENT_AMBULATORY_CARE_PROVIDER_SITE_OTHER): Payer: BLUE CROSS/BLUE SHIELD | Admitting: Family Medicine

## 2016-11-30 ENCOUNTER — Encounter: Payer: Self-pay | Admitting: Family Medicine

## 2016-11-30 VITALS — BP 102/80 | HR 68 | Ht 61.0 in | Wt 138.0 lb

## 2016-11-30 DIAGNOSIS — M25512 Pain in left shoulder: Secondary | ICD-10-CM

## 2016-11-30 DIAGNOSIS — M999 Biomechanical lesion, unspecified: Secondary | ICD-10-CM | POA: Diagnosis not present

## 2016-11-30 NOTE — Patient Instructions (Signed)
Great to see you  Work on [employer] is your friend.  pennsaid pinkie amount topically 2 times daily as needed.  Duexis 1 pill 3 times a day for 3 days Gabapenitn at night See me again in 10-12 days and if not better lets consider trigger points.  Otherwise see me every 2 months for manipulation .;

## 2016-11-30 NOTE — Progress Notes (Signed)
[facility] [place], [place] [address] Phone: [phone] Subjective:    I'm seeing this patient by the request  of:  Mauricio Po, FNP   CC: Neck and back pain Follow-up  [id number]  Veronica Booth is a 56 y.o. female coming in with complaint of Neck pain. Patient is a past medical history significant for mild degenerative disc disease of the cervical spine. Patient does have an MRI. MRI was independently visualized by me showing some mild foraminal narrowing at C4-C5 mostly on the right side. No true stenosis noted. This is from 2014.  Patient hadn't seen me recently and did have trigger point injections.   Since of date patient was to do home exercises. Patient was started on gabapentin 300 mg at night. We did attempt osteopathic manipulation. This is nearly 3 months ago. Patient states since then started having worsening pain over the course last several weeks. Patient hasn't been doing very well opted point. Starting have increasing tightness again. Seems to be mostly on the left side. Left trapezius area again. States that it is severe enough that it is waking her up at night.    No past medical history on file. Past Surgical History:  Procedure Laterality Date  . CESAREAN SECTION     Social History   Social History  . Marital status: Married    Spouse name: N/A  . Number of children: N/A  . Years of education: N/A   Social History Main Topics  . Smoking status: Former Research scientist (life sciences)  . Smokeless tobacco: Never Used  . Alcohol use No  . Drug use: No  . Sexual activity: Not on file   Other Topics Concern  . Not on file   Social History Narrative  . No narrative on file   No Known Allergies No family history on file. No history of rheumatological diseases  Past medical history, social, surgical and family history all reviewed in electronic medical record.  No pertanent information unless stated regarding to the chief  complaint.   Review of Systems: No headache, visual changes, nausea, vomiting, diarrhea, constipation, dizziness, abdominal pain, skin rash, fevers, chills, night sweats, weight loss, swollen lymph nodes, body aches, joint swelling, muscle aches, chest pain, shortness of breath, mood changes.    Objective  There were no vitals taken for this visit.  Systems examined below as of 11/30/16 General: NAD A&O x3 mood, affect normal  HEENT: Pupils equal, extraocular movements intact no nystagmus Respiratory: not short of breath at rest or with speaking Cardiovascular: No lower extremity edema, non tender Skin: Warm dry intact with no signs of infection or rash on extremities or on axial skeleton. Abdomen: Soft nontender, no masses Neuro: Cranial nerves  intact, neurovascularly intact in all extremities with 2+ DTRs and 2+ pulses. Lymph: No lymphadenopathy appreciated today  Gait normal with good balance and coordination.  MSK: Non tender with full range of motion and good stability and symmetric strength and tone of shoulders, elbows, wrist,  knee hips and ankles bilaterally.       Neck: Inspection unremarkable. No palpable stepoffs. Negative Spurling's maneuver. Full neck range of motion Grip strength and sensation normal in bilateral hands Strength good C4 to T1 distribution No sensory change to C4 to T1 Negative Hoffman sign bilaterally Reflexes normal Increasing tenderness in the left trapezius muscle again. Muscle spasm noted.  Osteopathic findings Cervical C2 flexed rotated and side bent right C6 flexed rotated and side bent left  T1 extended rotated and side bent left inhaled third rib T9 extended rotated and side bent left L3 flexed rotated and side bent right Sacrum right on right     Impression and Recommendations:     This case required medical decision making of moderate complexity.      Note: This dictation was prepared with Dragon dictation along with smaller  phrase technology. Any transcriptional errors that result from this process are unintentional.

## 2016-11-30 NOTE — Assessment & Plan Note (Signed)
Worsening trigger points in the left shoulder. I do think that this could be secondary to her neck again. We discussed continuing the gabapentin on a regular basis. Responded fairly well to osteopathic manipulation. We discussed icing regimen. We discussed objective is to do a which ones to avoid. Patient will continue stay active. Follow-up again in 3 weeks. Worsening symptoms consider trigger point injections.

## 2016-11-30 NOTE — Assessment & Plan Note (Signed)
Decision today to treat with OMT was based on Physical Exam  After verbal consent patient was treated with HVLA, ME, FPR techniques in cervical, thoracic, rib lumbar and sacral areas  Patient tolerated the procedure well with improvement in symptoms  Patient given exercises, stretches and lifestyle modifications  See medications in patient instructions if given  Patient will follow up in 3 weeks 

## 2016-12-12 ENCOUNTER — Ambulatory Visit: Payer: No Typology Code available for payment source | Admitting: Family Medicine

## 2016-12-23 DIAGNOSIS — B308 Other viral conjunctivitis: Secondary | ICD-10-CM | POA: Diagnosis not present

## 2016-12-23 DIAGNOSIS — Z6825 Body mass index (BMI) 25.0-25.9, adult: Secondary | ICD-10-CM | POA: Diagnosis not present

## 2017-01-05 ENCOUNTER — Other Ambulatory Visit: Payer: Self-pay | Admitting: Family

## 2017-01-05 DIAGNOSIS — M542 Cervicalgia: Secondary | ICD-10-CM

## 2017-01-05 DIAGNOSIS — M25561 Pain in right knee: Secondary | ICD-10-CM

## 2017-01-10 DIAGNOSIS — Z01419 Encounter for gynecological examination (general) (routine) without abnormal findings: Secondary | ICD-10-CM | POA: Diagnosis not present

## 2017-01-10 DIAGNOSIS — Z1231 Encounter for screening mammogram for malignant neoplasm of breast: Secondary | ICD-10-CM | POA: Diagnosis not present

## 2017-01-10 DIAGNOSIS — Z6826 Body mass index (BMI) 26.0-26.9, adult: Secondary | ICD-10-CM | POA: Diagnosis not present

## 2017-01-10 DIAGNOSIS — N951 Menopausal and female climacteric states: Secondary | ICD-10-CM | POA: Diagnosis not present

## 2017-01-10 DIAGNOSIS — N912 Amenorrhea, unspecified: Secondary | ICD-10-CM | POA: Diagnosis not present

## 2017-03-19 ENCOUNTER — Other Ambulatory Visit: Payer: Self-pay | Admitting: Family

## 2017-03-19 DIAGNOSIS — M25561 Pain in right knee: Secondary | ICD-10-CM

## 2017-03-19 DIAGNOSIS — M542 Cervicalgia: Secondary | ICD-10-CM

## 2017-05-15 ENCOUNTER — Other Ambulatory Visit: Payer: Self-pay | Admitting: Family

## 2017-05-15 DIAGNOSIS — M542 Cervicalgia: Secondary | ICD-10-CM

## 2017-05-15 DIAGNOSIS — M25561 Pain in right knee: Secondary | ICD-10-CM

## 2019-01-23 ENCOUNTER — Telehealth: Payer: Self-pay | Admitting: *Deleted

## 2019-01-23 NOTE — Telephone Encounter (Signed)
REFERRAL SENT TO [facility] AND NOTES IN PROFICIENT [place], [place].

## 2019-01-27 DIAGNOSIS — R0789 Other chest pain: Secondary | ICD-10-CM

## 2019-01-27 DIAGNOSIS — R079 Chest pain, unspecified: Secondary | ICD-10-CM | POA: Insufficient documentation

## 2019-01-27 NOTE — Progress Notes (Signed)
Virtual Visit via Video Note   This visit type was conducted due to national recommendations for restrictions regarding the COVID-19 Pandemic (e.g. social distancing) in an effort to limit this patient's exposure and mitigate transmission in our community.  Due to her co-morbid illnesses, this patient is at least at moderate risk for complications without adequate follow up.  This format is felt to be most appropriate for this patient at this time.  All issues noted in this document were discussed and addressed.  A limited physical exam was performed with this format.  Please refer to the patient's chart for her consent to telehealth for [facility].   Evaluation Performed:  Cardiology Consult  This visit type was conducted due to national recommendations for restrictions regarding the COVID-19 Pandemic (e.g. social distancing).  This format is felt to be most appropriate for this patient at this time.  All issues noted in this document were discussed and addressed.  No physical exam was performed (except for noted visual exam findings with Video Visits).  Please refer to the patient's chart (MyChart message for video visits and phone note for telephone visits) for the patient's consent to telehealth for [facility].  Date:  01/28/2019   ID:  Veronica Booth, DOB Sep 20, 1961, MRN [id number]  Patient Location:  Home  Provider location:   [place]  PCP:  Golden Circle, FNP  Cardiologist:  NEW Electrophysiologist:  None   Chief Complaint:  Chest pain  History of Present Illness:    Veronica Booth is a 58 y.o. female who presents via audio/video conferencing for a telehealth visit today in referral by her PCP Mauricio Po, FNP for evaluation of chest pain.  The patient states that a few weeks ago she started developing upper respiratory symptoms including chills, cough and severe fatigue.  She called the urgent care who thought she had an upper respiratory infection and  prescribed Mucinex DM.  This did not really help.  Last week because she was not getting any better she was seen again at a walk-in clinic but did not get much further help.  Last week she started having chest pain that she described as feeling like an elephant was sitting on her chest that was intermittent associated with nausea and severe lethargy.  She also says that she had the worst headache that she is ever had.   She went to the [place] walk-in clinic last week and was tested for coronavirus which was negative.  Chest x-ray was normal.  There were multiple labs that were ordered but I do not have the results of those.  She says now she is having shortness of breath with minimal exertion.  She is also been having intermittent right arm numbness.  Intermittently she will feel this severe heaviness on her chest.  She does not have any nausea or diaphoresis with the chest discomfort.  She has not had any lower extremity edema but has had significant PND as well as 2 pillow orthopnea secondary to shortness of breath as well as chest pain.  She says it is difficult for her to sleep at night and she cannot lie flat in her bed because of the chest pain.  On video exam today she is coughing continuously and says that her chest hurts bad.  She says she has not had any fevers but thinks she has had some chills and is very exhausted.  She continues to have the headache.  The patient does have symptoms concerning  for COVID-19 infection ( chills, cough and  new shortness of breath).    Prior CV studies:   The following studies were reviewed today:  none  Past Medical History:  Diagnosis Date  . Body mass index (bmi) 25.0-25.9, adult   . Chest pain   . Cough   . Deep dyspareunia   . DJD (degenerative joint disease) of cervical spine   . Elevated blood pressure reading without diagnosis of hypertension   . Heart palpitations   . History of tobacco abuse   . Insomnia   . Light headedness   . Low  vitamin D level   . Right hand paresthesia   . Urinary frequency    Past Surgical History:  Procedure Laterality Date  . CESAREAN SECTION       Current Meds  Medication Sig  . albuterol (VENTOLIN HFA) 108 (90 Base) MCG/ACT inhaler Inhale 1 puff into the lungs every 6 (six) hours as needed for wheezing or shortness of breath.  . Cetirizine-Pseudoephedrine (ZYRTEC-D PO) Take by mouth.  Marland Kitchen lisinopril (ZESTRIL) 10 MG tablet Take 10 mg by mouth daily.  Marland Kitchen PENNSAID 2 % SOLN Place 1 application onto the skin 2 (two) times daily as needed.     Allergies:   Patient has no known allergies.   Social History   Tobacco Use  . Smoking status: Former Research scientist (life sciences)  . Smokeless tobacco: Never Used  Substance Use Topics  . Alcohol use: No    Alcohol/week: 0.0 standard drinks  . Drug use: No     Family Hx: The patient's family history includes Canavan disease in her paternal aunt; Cancer in her maternal aunt and paternal aunt; High Cholesterol in her father; Hodgkin's lymphoma in her child; Other in her mother.  ROS:   Please see the history of present illness.     All other systems reviewed and are negative.   Labs/Other Tests and Data Reviewed:    Recent Labs: No results found for requested labs within last 8760 hours.   Recent Lipid Panel No results found for: CHOL, TRIG, HDL, CHOLHDL, LDLCALC, LDLDIRECT  Wt Readings from Last 3 Encounters:  01/28/19 135 lb (61.2 kg)  11/30/16 138 lb (62.6 kg)  09/01/16 141 lb (64 kg)     Objective:    Vital Signs:  BP 126/85   Pulse 66   Ht 5\' 1"  (1.549 m)   Wt 135 lb (61.2 kg)   BMI 25.51 kg/m    CONSTITUTIONAL:  Well nourished, well developed female in no acute distress.  EYES: anicteric MOUTH: oral mucosa is pink RESPIRATORY: Normal respiratory effort, symmetric expansion CARDIOVASCULAR: No peripheral edema SKIN: No rash, lesions or ulcers MUSCULOSKELETAL: no digital cyanosis NEURO: Cranial Nerves II-XII grossly intact, moves all  extremities PSYCH: Intact judgement and insight.  A&O x 3, Mood/affect appropriate   ASSESSMENT & PLAN:    1.  Chest pain -her chest pain is concerning.  Given her recent URI I am wondering whether she could have acute pericarditis.  She is having significant shortness of breath now but no lower extremity edema.  Her shortness of breath and chest pressure are so bad that she cannot lie flat in bed at night and has to prop herself up on at least 2 pillows.  She said the pain in her chest will be so severe that it feels like an elephant is sitting on it and she is now short of breath with minimal exertion.  Apparently a troponin was checked last  week and the urgent care office at [facility] that was normal.  She also had a COVID-19 test done that was negative but a lot of her symptoms are very characteristic of COVID-19 and I am wondering whether this was a false negative study.  She looks very uncomfortable on video.  I have recommended that she proceed to [facility] for further evaluation.  I think she needs to have a 2D echo done to make sure she does not have a viral pericarditis or myopericarditis.  She should have troponins repeated and repeat COVID-19 testing.  The patient agrees to proceed to the emergency room for further evaluation.  2.  COVID-19 Education:The signs and symptoms of COVID-19 were discussed with the patient and how to seek care for testing (follow up with PCP or arrange E-visit).  The importance of social distancing was discussed today.  Patient Risk:   After full review of this patient's clinical status, I feel that they are at least moderate risk at this time.  Time:   Today, I have spent 30 minutes directly with the patient on video discussing medical problems including chest pain.  We also reviewed the symptoms of COVID 19 and the ways to protect against contracting the virus with telehealth technology.  I spent an additional 10 minutes reviewing patient's chart including  office notes from PCP.  Medication Adjustments/Labs and Tests Ordered: Current medicines are reviewed at length with the patient today.  Concerns regarding medicines are outlined above.  Tests Ordered: No orders of the defined types were placed in this encounter.  Medication Changes: No orders of the defined types were placed in this encounter.   Disposition:  Follow up prn  Signed, Fransico Him, MD  01/28/2019 11:37 AM    [place]

## 2019-01-28 ENCOUNTER — Encounter (HOSPITAL_COMMUNITY): Payer: Self-pay

## 2019-01-28 ENCOUNTER — Emergency Department (HOSPITAL_COMMUNITY): Payer: BLUE CROSS/BLUE SHIELD

## 2019-01-28 ENCOUNTER — Other Ambulatory Visit: Payer: Self-pay

## 2019-01-28 ENCOUNTER — Telehealth: Payer: Self-pay

## 2019-01-28 ENCOUNTER — Emergency Department (HOSPITAL_COMMUNITY)
Admission: EM | Admit: 2019-01-28 | Discharge: 2019-01-28 | Disposition: A | Discharge status: Home / Self Care | Payer: BLUE CROSS/BLUE SHIELD | Attending: Emergency Medicine | Admitting: Emergency Medicine

## 2019-01-28 ENCOUNTER — Telehealth (INDEPENDENT_AMBULATORY_CARE_PROVIDER_SITE_OTHER): Payer: BLUE CROSS/BLUE SHIELD | Admitting: Cardiology

## 2019-01-28 VITALS — BP 126/85 | HR 66 | Ht 61.0 in | Wt 135.0 lb

## 2019-01-28 DIAGNOSIS — R0602 Shortness of breath: Secondary | ICD-10-CM

## 2019-01-28 DIAGNOSIS — R079 Chest pain, unspecified: Secondary | ICD-10-CM

## 2019-01-28 DIAGNOSIS — Z20828 Contact with and (suspected) exposure to other viral communicable diseases: Secondary | ICD-10-CM | POA: Diagnosis not present

## 2019-01-28 DIAGNOSIS — Z79899 Other long term (current) drug therapy: Secondary | ICD-10-CM | POA: Diagnosis not present

## 2019-01-28 DIAGNOSIS — R05 Cough: Secondary | ICD-10-CM | POA: Diagnosis not present

## 2019-01-28 DIAGNOSIS — J069 Acute upper respiratory infection, unspecified: Secondary | ICD-10-CM

## 2019-01-28 DIAGNOSIS — Z87891 Personal history of nicotine dependence: Secondary | ICD-10-CM | POA: Insufficient documentation

## 2019-01-28 DIAGNOSIS — R0789 Other chest pain: Secondary | ICD-10-CM | POA: Diagnosis not present

## 2019-01-28 DIAGNOSIS — R059 Cough, unspecified: Secondary | ICD-10-CM

## 2019-01-28 DIAGNOSIS — Z7189 Other specified counseling: Secondary | ICD-10-CM

## 2019-01-28 LAB — CBC
HCT: 38 % (ref 36.0–46.0)
Hemoglobin: 12.2 g/dL (ref 12.0–15.0)
MCH: 28.9 pg (ref 26.0–34.0)
MCHC: 32.1 g/dL (ref 30.0–36.0)
MCV: 90 fL (ref 80.0–100.0)
Platelets: 223 10*3/uL (ref 150–400)
RBC: 4.22 MIL/uL (ref 3.87–5.11)
RDW: 13.1 % (ref 11.5–15.5)
WBC: 5.7 10*3/uL (ref 4.0–10.5)
nRBC: 0 % (ref 0.0–0.2)

## 2019-01-28 LAB — BASIC METABOLIC PANEL
Anion gap: 12 (ref 5–15)
BUN: 17 mg/dL (ref 6–20)
CO2: 24 mmol/L (ref 22–32)
Calcium: 9.4 mg/dL (ref 8.9–10.3)
Chloride: 104 mmol/L (ref 98–111)
Creatinine, Ser: 0.59 mg/dL (ref 0.44–1.00)
GFR calc Af Amer: >60 mL/min (ref >60)
GFR calc non Af Amer: >60 mL/min (ref >60)
Glucose, Bld: 85 mg/dL (ref 70–99)
Potassium: 3.8 mmol/L (ref 3.5–5.1)
Sodium: 140 mmol/L (ref 135–145)

## 2019-01-28 LAB — SARS CORONAVIRUS 2 BY RT PCR (HOSPITAL ORDER, PERFORMED IN ~~LOC~~ HOSPITAL LAB): SARS Coronavirus 2: NEGATIVE

## 2019-01-28 LAB — D-DIMER, QUANTITATIVE: D-Dimer, Quant: <0.27 ug/mL-FEU (ref 0.00–0.50)

## 2019-01-28 LAB — TROPONIN I: Troponin I: <0.03 ng/mL (ref <0.03)

## 2019-01-28 NOTE — ED Triage Notes (Signed)
Pt feeling chest pain on Sunday, light headedness, nausea, right arm pain.

## 2019-01-28 NOTE — ED Provider Notes (Signed)
[facility] EMERGENCY DEPARTMENT Provider Note   CSN: [id number] Arrival date & time: 01/28/19  1540    History   Chief Complaint Chief Complaint  Patient presents with  . Chest Pain    HPI Veronica Booth is a 58 y.o. female.     Patient with history of smoking (quit 1 year ago) --presents to the emergency department today with complaint of chest pressure.  Patient reports having a significant cough over the past 1 to 2 weeks.  She has seen her doctor for this and reportedly had a negative COVID test last week.  On Sunday she developed an intense pressure described as something sitting on her chest along with right arm tingling and heaviness and a associated headache.  She also notes increased shortness of breath with exertion such as walking up a flight of stairs.  Patient went to an urgent care and had an EKG and chest x-ray done.  She followed up with her doctor the next day who ordered lab work including a troponin.  These were reportedly negative per the patient report.  She was referred to cardiology.  Patient had an ED visit this morning with Dr. Radford Pax and was encouraged to come to the emergency department for further work-up and evaluation including an echocardiogram.  It was recommended that the patient have repeat COVID testing as well.  She continues to have significant cough and uncomfortable chest pressure.  Patient was recently diagnosed with hypertension and started on medication for this.  She denies a history of high cholesterol, diabetes, heart disease in a first-degree relatives however she has had several uncles who have had heart attacks.  She denies history of blood clot. Patient denies risk factors for pulmonary embolism including: unilateral leg swelling, history of DVT/PE/other blood clots, use of exogenous hormones, recent immobilizations, recent surgery, recent travel (>4hr segment), malignancy, hemoptysis.       Past Medical History:   Diagnosis Date  . Body mass index (bmi) 25.0-25.9, adult   . Chest pain   . Cough   . Deep dyspareunia   . DJD (degenerative joint disease) of cervical spine   . Elevated blood pressure reading without diagnosis of hypertension   . Heart palpitations   . History of tobacco abuse   . Insomnia   . Light headedness   . Low vitamin D level   . Right hand paresthesia   . Urinary frequency     Patient Active Problem List   Diagnosis Date Noted  . Chest pain of uncertain etiology 46/96/2952  . Nonallopathic lesion of cervical region 08/04/2016  . Nonallopathic lesion of thoracic region 08/04/2016  . Nonallopathic lesion of sacral region 08/04/2016  . Nonallopathic lesion of lumbosacral region 08/04/2016  . Trigger point of left shoulder region 07/14/2016  . Poor posture 07/14/2016  . Cervical disc disorder with radiculopathy of cervical region 07/14/2016  . Cervical pain (neck) 03/31/2016  . Right knee pain 03/31/2016    Past Surgical History:  Procedure Laterality Date  . CESAREAN SECTION       OB History   No obstetric history on file.      Home Medications    Prior to Admission medications   Medication Sig Start Date End Date Taking? Authorizing Provider  albuterol (VENTOLIN HFA) 108 (90 Base) MCG/ACT inhaler Inhale 1 puff into the lungs every 6 (six) hours as needed for wheezing or shortness of breath.    [provider]  Cetirizine-Pseudoephedrine (ZYRTEC-D PO) Take  by mouth.    [provider]  lisinopril (ZESTRIL) 10 MG tablet Take 10 mg by mouth daily.    [provider]  PENNSAID 2 % SOLN Place 1 application onto the skin 2 (two) times daily as needed. 10/10/16   Golden Circle, FNP    Family History Family History  Problem Relation Age of Onset  . Other Mother        ACCIDENTALLY  . High Cholesterol Father   . Canavan disease Paternal Aunt   . Cancer Paternal Aunt        BREAST CANCER  . Cancer Maternal Aunt        BREAST  CANCER  . Hodgkin's lymphoma Child     Social History Social History   Tobacco Use  . Smoking status: Former Research scientist (life sciences)  . Smokeless tobacco: Never Used  Substance Use Topics  . Alcohol use: No    Alcohol/week: 0.0 standard drinks  . Drug use: No     Allergies   Patient has no known allergies.   Review of Systems Review of Systems  Constitutional: Negative for diaphoresis and fever.  Eyes: Negative for redness.  Respiratory: Positive for cough and shortness of breath.   Cardiovascular: Positive for chest pain. Negative for palpitations and leg swelling.  Gastrointestinal: Negative for abdominal pain, nausea and vomiting.  Genitourinary: Negative for dysuria.  Musculoskeletal: Negative for back pain and neck pain.  Skin: Negative for rash.  Neurological: Positive for numbness and headaches (resolved). Negative for syncope and light-headedness.  Psychiatric/Behavioral: The patient is not nervous/anxious.      Physical Exam Updated Vital Signs BP 131/74   Pulse 68   Temp 98.2 F (36.8 C) (Oral)   Resp 19   Ht 5\' 1"  (1.549 m)   Wt 61.2 kg   SpO2 99%   BMI 25.51 kg/m   Physical Exam Vitals signs and nursing note reviewed.  Constitutional:      Appearance: She is well-developed.  HENT:     Head: Normocephalic and atraumatic.  Eyes:     General:        Right eye: No discharge.        Left eye: No discharge.     Conjunctiva/sclera: Conjunctivae normal.  Neck:     Musculoskeletal: Normal range of motion and neck supple.  Cardiovascular:     Rate and Rhythm: Normal rate and regular rhythm.     Heart sounds: Normal heart sounds.  Pulmonary:     Effort: Pulmonary effort is normal.     Breath sounds: Normal breath sounds.  Abdominal:     Palpations: Abdomen is soft.     Tenderness: There is no abdominal tenderness.  Skin:    General: Skin is warm and dry.  Neurological:     Mental Status: She is alert.      ED Treatments / Results  Labs (all labs  ordered are listed, but only abnormal results are displayed) Labs Reviewed  SARS CORONAVIRUS 2 (HOSPITAL ORDER, [facility])  CBC  BASIC METABOLIC PANEL  TROPONIN I  D-DIMER, QUANTITATIVE (NOT AT [facility])    EKG EKG Interpretation  Date/Time:  Tuesday January 28 2019 15:53:35 EDT Ventricular Rate:  73 PR Interval:    QRS Duration: 78 QT Interval:  395 QTC Calculation: 436 R Axis:   49 Text Interpretation:  Sinus rhythm since last tracing no significant change Confirmed by Malvin Johns [phone]) on 01/28/2019 4:41:00 PM   Radiology Dg Chest  Port 1 View  Result Date: 01/28/2019 CLINICAL DATA:  Chest pain, right arm pain, shortness of breath EXAM: PORTABLE CHEST 1 VIEW COMPARISON:  10/20/2016 FINDINGS: The heart size and mediastinal contours are within normal limits. Both lungs are clear. The visualized skeletal structures are unremarkable. IMPRESSION: No acute abnormality of the lungs in AP portable projection. Electronically Signed   By: Eddie Candle M.D.   On: 01/28/2019 16:20    Procedures Procedures (including critical care time)  Medications Ordered in ED Medications - No data to display   Initial Impression / Assessment and Plan / ED Course  I have reviewed the triage vital signs and the nursing notes.  Pertinent labs & imaging results that were available during my care of the patient were reviewed by me and considered in my medical decision making (see chart for details).        Patient seen and examined.  Reviewed cardiology note.  Cardiology consult and recommendations requested.  EKG personally reviewed.  Vital signs reviewed and are as follows: BP 131/74   Pulse 68   Temp 98.2 F (36.8 C) (Oral)   Resp 19   Ht 5\' 1"  (1.549 m)   Wt 61.2 kg   SpO2 99%   BMI 25.51 kg/m   7:30 PM remainder of patient's work-up was reassuring.  Negative COVID-19 test.  Patient has been seen by cardiology (Barrett and Dr. Burt Knack).  She has been cleared  for discharged home at this time.  She will follow-up with her primary care doctor.  Patient updated and in agreement.  Encouraged return to the emergency department with worsening chest pain, shortness of breath, trouble breathing, new symptoms or other concerns.  She verbalizes understanding agrees with plan.  Final Clinical Impressions(s) / ED Diagnoses   Final diagnoses:  Chest tightness  Cough   Patient with ongoing chest tightness in setting of cough, probable URI/bronchitis.  She was sent in today for cardiac evaluation.  Troponin has been negative.  COVID-19 testing negative.  EKG is normal without signs of ischemia.  She has been evaluated by cardiology in the emergency department and feels that she is stable and low risk for discharged home at this point.  She will follow-up as outpatient this point.  Return instructions as above.  ED Discharge Orders    None       Carlisle Cater, Hershal Coria 01/28/19 1932    Malvin Johns, MD 01/28/19 [phone]

## 2019-01-28 NOTE — Discharge Instructions (Signed)
Please read and follow all provided instructions.  Your diagnoses today include:  1. Chest tightness   2. Chest pain   3. Cough     Tests performed today include:  An EKG of your heart  A chest x-ray  Cardiac enzymes - a blood test for heart muscle damage  Blood counts and electrolytes  Vital signs. See below for your results today.   Medications prescribed:   None  Take any prescribed medications only as directed.  Follow-up instructions: Please follow-up with your primary care provider as soon as you can for further evaluation of your symptoms.   Return instructions:  SEEK IMMEDIATE MEDICAL ATTENTION IF:  You have severe chest pain, especially if the pain is crushing or pressure-like and spreads to the arms, back, neck, or jaw, or if you have sweating, nausea (feeling sick to your stomach), or shortness of breath. THIS IS AN EMERGENCY. Don't wait to see if the pain will go away. Get medical help at once. Call 911 or 0 (operator). DO NOT drive yourself to the hospital.   Your chest pain gets worse and does not go away with rest.   You have an attack of chest pain lasting longer than usual, despite rest and treatment with the medications your caregiver has prescribed.   You wake from sleep with chest pain or shortness of breath.  You feel dizzy or faint.  You have chest pain not typical of your usual pain for which you originally saw your caregiver.   You have any other emergent concerns regarding your health.  Additional Information: Chest pain comes from many different causes. Your caregiver has diagnosed you as having chest pain that is not specific for one problem, but does not require admission.  You are at low risk for an acute heart condition or other serious illness.   Your vital signs today were: BP 122/88    Pulse 66    Temp 98.2 F (36.8 C) (Oral)    Resp 19    Ht 5\' 1"  (1.549 m)    Wt 61.2 kg    SpO2 100%    BMI 25.51 kg/m  If your blood pressure (BP)  was elevated above 135/85 this visit, please have this repeated by your doctor within one month. --------------

## 2019-01-28 NOTE — Telephone Encounter (Signed)

## 2019-01-28 NOTE — Consult Note (Addendum)
Cardiology Consult:   Patient ID: Veronica Booth; MRN: [id number]; DOB: March 05, 1961   Admission date: 01/28/2019  Primary Care Provider: Jenny Reichmann, PA-C Primary Cardiologist: Fransico Him, MD 01/28/2019 Evisit Primary Electrophysiologist:  None  Chief Complaint:  Chest pain  Patient Profile:   Veronica Booth is a 57 y.o. female with a history of palpitations, DJD, ?HTN, Tob use, insomnia, who was referred to Dr Radford Pax for palpitations.   History of Present Illness:   Veronica Booth was evaluated by Dr Radford Pax and was complaining of exertional SOB and chest pain. She was sent to the ER for admission.   She has a history of bronchitis>>PNA. Her symptoms for this would be chest pressure and tightness, and have SOB and cough. She has had this twice in the past.   About 3 weeks ago, she developed a cough. Last Monday, she got chest tightness and pressure. Did not have a thermometer>>no temp check. No chills until Monday. She was also sweaty. No change w/ deep inspiration. No chest wall tenderness.  Monday was the worst day, 7/10. The chest pain has been continuous since then, but not at a 7. She went to her PCP, her BP was 160/100, was given ASA 81 mg x 4. She was prescribed lisinopril 10 mg qd and bought a BP cuff. After a day or so, her BP improved slowly>>was down to her normal of approx 120/80.   Along with this, her CP improved, is now down to a 3-4/10. It has not been a 0/10 since it started.   Prior to her acute illness, she was able to climb the stairs in her home, was able to do anything she wished, not limited by chest pain or SOB.  In the last week, she has been waking in the night with SOB, no LE edema, is sleeping on an extra pillow.    Past Medical History:  Diagnosis Date  . Body mass index (bmi) 25.0-25.9, adult   . Chest pain   . Cough   . Deep dyspareunia   . DJD (degenerative joint disease) of cervical spine   . Elevated blood pressure reading  without diagnosis of hypertension   . Heart palpitations   . History of tobacco abuse   . Insomnia   . Light headedness   . Low vitamin D level   . Right hand paresthesia   . Urinary frequency     Past Surgical History:  Procedure Laterality Date  . CESAREAN SECTION       Medications Prior to Admission: Prior to Admission medications   Medication Sig Start Date End Date Taking? Authorizing Provider  albuterol (VENTOLIN HFA) 108 (90 Base) MCG/ACT inhaler Inhale 1 puff into the lungs every 6 (six) hours as needed for wheezing or shortness of breath.    [provider]  Cetirizine-Pseudoephedrine (ZYRTEC-D PO) Take by mouth.    [provider]  lisinopril (ZESTRIL) 10 MG tablet Take 10 mg by mouth daily.    [provider]  PENNSAID 2 % SOLN Place 1 application onto the skin 2 (two) times daily as needed. 10/10/16   Golden Circle, FNP     Allergies:   No Known Allergies  Social History:   Social History   Socioeconomic History  . Marital status: Married    Spouse name: Not on file  . Number of children: Not on file  . Years of education: Not on file  . Highest education level: Not on file  Occupational  History  . Occupation: Database administrator: [employer]  Social Needs  . Financial resource strain: Not on file  . Food insecurity:    Worry: Not on file    Inability: Not on file  . Transportation needs:    Medical: Not on file    Non-medical: Not on file  Tobacco Use  . Smoking status: Former Research scientist (life sciences)  . Smokeless tobacco: Never Used  Substance and Sexual Activity  . Alcohol use: No    Alcohol/week: 0.0 standard drinks  . Drug use: No  . Sexual activity: Not on file  Lifestyle  . Physical activity:    Days per week: Not on file    Minutes per session: Not on file  . Stress: Not on file  Relationships  . Social connections:    Talks on phone: Not on file    Gets together: Not on file    Attends religious service:  Not on file    Active member of club or organization: Not on file    Attends meetings of clubs or organizations: Not on file    Relationship status: Not on file  . Intimate partner violence:    Fear of current or ex partner: Not on file    Emotionally abused: Not on file    Physically abused: Not on file    Forced sexual activity: Not on file  Other Topics Concern  . Not on file  Social History Narrative  . Not on file    Family History:   The patient's family history includes Canavan disease in her paternal aunt; Cancer in her maternal aunt and paternal aunt; High Cholesterol in her father; Hodgkin's lymphoma in her child; Other in her mother.   The patient She indicated that her mother is deceased. She indicated that the status of her father is unknown. She indicated that both of her children are alive. She indicated that the status of her maternal aunt is unknown. She indicated that the status of her paternal aunt is unknown.  ROS:  Please see the history of present illness.  All other ROS reviewed and negative.     Physical Exam/Data:   Vitals:   01/28/19 1550 01/28/19 1554 01/28/19 1600 01/28/19 1700  BP:  131/74 126/81 122/88  Pulse:  68 72 66  Resp:  19 19 19   Temp:  98.2 F (36.8 C)    TempSrc:  Oral    SpO2:  99% 100% 100%  Weight: 61.2 kg     Height: 5\' 1"  (1.549 m)      No intake or output data in the 24 hours ending 01/28/19 1721 Filed Weights   01/28/19 1550  Weight: 61.2 kg   Body mass index is 25.51 kg/m.  General:  Well nourished, well developed, in mild respiratory distress HEENT: normal Lymph: no adenopathy Neck:  JVD not elevated Endocrine:  No thryomegaly Vascular: No carotid bruits; FA pulses 2+ bilaterally  Cardiac:  normal S1, S2; RRR; murmur, no rub or gallop  Lungs:  Few scattered rales bilaterally, no wheezing, rhonchi   Abd: soft, nontender, no hepatomegaly  Ext: no edema Musculoskeletal:  No deformities, BUE and BLE strength normal and  equal Skin: warm and dry  Neuro:  CNs 2-12 intact, no focal abnormalities noted Psych:  Normal affect    EKG:  The ECG that was done  was personally reviewed and demonstrates SR, HR 73, slightly early R wave transition, no acute ischemic changes. No sig change  from 01/22/2019  Relevant CV Studies:  None  Laboratory Data:  Chemistry Recent Labs  Lab 01/28/19 1607  NA 140  K 3.8  CL 104  CO2 24  GLUCOSE 85  BUN 17  CREATININE 0.59  CALCIUM 9.4  GFRNONAA >60  GFRAA >60  ANIONGAP 12    No results for input(s): PROT, ALBUMIN, AST, ALT, ALKPHOS, BILITOT in the last 168 hours. Hematology Recent Labs  Lab 01/28/19 1607  WBC 5.7  RBC 4.22  HGB 12.2  HCT 38.0  MCV 90.0  MCH 28.9  MCHC 32.1  RDW 13.1  PLT 223   Cardiac Enzymes Recent Labs  Lab 01/28/19 1607  TROPONINI <0.03      DDimer  Recent Labs  Lab 01/28/19 1607  DDIMER <0.27    Radiology/Studies:  Dg Chest Port 1 View  Result Date: 01/28/2019 CLINICAL DATA:  Chest pain, right arm pain, shortness of breath EXAM: PORTABLE CHEST 1 VIEW COMPARISON:  10/20/2016 FINDINGS: The heart size and mediastinal contours are within normal limits. Both lungs are clear. The visualized skeletal structures are unremarkable. IMPRESSION: No acute abnormality of the lungs in AP portable projection. Electronically Signed   By: Eddie Candle M.D.   On: 01/28/2019 16:20    Assessment and Plan:   Active Problems:   Chest pain of uncertain etiology - continuous CP x 7 days w/out ischemic ECG changes or enzyme elevation. - repeat COVID test ordered.  - at this time, would allow her to recover from the respiratory issues and then f/u in office - may need echo or stress test, but consider doing as outpt.  For questions or updates, please contact [place] Please consult [email or web address] for contact info under Cardiology/STEMI.   SignedRosaria Ferries, PA-C  01/28/2019 5:21 PM   Patient seen, examined. Available data  reviewed. Agree with findings, assessment, and plan as outlined by Rosaria Ferries, PA-C. On my exam today: Vitals:   01/28/19 1600 01/28/19 1700  BP: 126/81 122/88  Pulse: 72 66  Resp: 19 19  Temp:    SpO2: 100% 100%   Pt is alert and oriented, NAD, the patient is resting comfortably HEENT: normal Neck: JVP - normal, carotids 2+= without bruits Lungs: CTA bilaterally CV: RRR without murmur or gallop Abd: soft, NT, Positive BS, no hepatomegaly Ext: no C/C/E, distal pulses intact and equal Back: Midline with no deformity Skin: warm/dry no rash Neurologic: Strength is intact and equal in the arms and legs bilaterally  EKG shows normal sinus rhythm and is within normal limits.  Troponin is negative.  The patient's SARS 2 coronavirus test is negative.  With 1 week of constant chest discomfort and normal EKG/troponin, this is very unlikely to be related to a cardiac problem.  The patient's symptoms sound typical of acute bronchitis.  She has a bronchitic cough during my evaluation.  The patient is a smoker with seasonal allergies.  I do not think she needs any further cardiac evaluation at this time.  I talked to her about following up with Dr. Radford Pax in a few months once the COVID-19 restrictions are lifted and if she has recurrent symptoms at that point, noninvasive stress testing could be considered.  Sherren Mocha, M.D. 01/28/2019 6:46 PM

## 2019-02-10 ENCOUNTER — Telehealth: Payer: No Typology Code available for payment source | Admitting: Cardiology

## 2019-03-26 DIAGNOSIS — F411 Generalized anxiety disorder: Secondary | ICD-10-CM | POA: Diagnosis not present

## 2019-03-26 DIAGNOSIS — I10 Essential (primary) hypertension: Secondary | ICD-10-CM | POA: Diagnosis not present

## 2019-03-26 DIAGNOSIS — G4700 Insomnia, unspecified: Secondary | ICD-10-CM | POA: Diagnosis not present

## 2019-03-26 DIAGNOSIS — Z6825 Body mass index (BMI) 25.0-25.9, adult: Secondary | ICD-10-CM | POA: Diagnosis not present

## 2019-04-29 ENCOUNTER — Telehealth: Payer: Self-pay

## 2019-04-29 ENCOUNTER — Telehealth: Payer: Self-pay | Admitting: Cardiology

## 2019-04-29 DIAGNOSIS — R0602 Shortness of breath: Secondary | ICD-10-CM | POA: Insufficient documentation

## 2019-04-29 NOTE — Telephone Encounter (Signed)
LM for pt to call back to switch from OV to VV with Fransico Him on 7/29. Also, need to get consent and update medications.

## 2019-04-29 NOTE — Progress Notes (Deleted)
Virtual Visit via Video Note   This visit type was conducted due to national recommendations for restrictions regarding the COVID-19 Pandemic (e.g. social distancing) in an effort to limit this patient's exposure and mitigate transmission in our community.  Due to her co-morbid illnesses, this patient is at least at moderate risk for complications without adequate follow up.  This format is felt to be most appropriate for this patient at this time.  All issues noted in this document were discussed and addressed.  A limited physical exam was performed with this format.  Please refer to the patient's chart for her consent to telehealth for [facility].   Evaluation Performed:  Follow-up visit  This visit type was conducted due to national recommendations for restrictions regarding the COVID-19 Pandemic (e.g. social distancing).  This format is felt to be most appropriate for this patient at this time.  All issues noted in this document were discussed and addressed.  No physical exam was performed (except for noted visual exam findings with Video Visits).  Please refer to the patient's chart (MyChart message for video visits and phone note for telephone visits) for the patient's consent to telehealth for [facility].  Date:  04/29/2019   ID:  Veronica Booth, DOB [birth year], MRN [id number]  Patient Location:  Home  Provider location:   [place]  PCP:  Jenny Reichmann, PA-C  Cardiologist:  Fransico Him, MD  Electrophysiologist:  None   Chief Complaint:  Chest pain  History of Present Illness:    Veronica Booth is a 58 y.o. female who presents via audio/video conferencing for a telehealth visit today.    This is a 58yo female with a hx of tobacco abuse who was seen in April for chest pain.  This seemed to start after a URI and described the pain as an elephant sitting on her chest with nausea.  COVID was negative.  She started having DOE, intermittent right arm numbness  and intermittent chest heaviness and CP was so bad that she could not lay flat in bed.  Due to concern that she may have acute pericarditis from recent URI she was sent to the ER for evaluation.    In ER Cxray was normal and trop was negative.  EKG showed no ischemic changes. She was seen by Cardiology with recommendations to followup for echo or stress test in the office as an outpt.    She is now here for followup.  She is here today for followup and is doing well.  She denies any chest pain or pressure, SOB, DOE, PND, orthopnea, LE edema, dizziness, palpitations or syncope. She is compliant with her meds and is tolerating meds with no SE.    The patient does not have symptoms concerning for COVID-19 infection (fever, chills, cough, or new shortness of breath).    Prior CV studies:   The following studies were reviewed today:  none  Past Medical History:  Diagnosis Date  . Body mass index (bmi) 25.0-25.9, adult   . Chest pain   . Cough   . Deep dyspareunia   . DJD (degenerative joint disease) of cervical spine   . Elevated blood pressure reading without diagnosis of hypertension   . Heart palpitations   . History of tobacco abuse   . Insomnia   . Light headedness   . Low vitamin D level   . Right hand paresthesia   . Urinary frequency    Past Surgical History:  Procedure Laterality  Date  . CESAREAN SECTION       No outpatient medications have been marked as taking for the 04/30/19 encounter (Appointment) with Sueanne Margarita, MD.     Allergies:   Patient has no known allergies.   Social History   Tobacco Use  . Smoking status: Former Research scientist (life sciences)  . Smokeless tobacco: Never Used  Substance Use Topics  . Alcohol use: No    Alcohol/week: 0.0 standard drinks  . Drug use: No     Family Hx: The patient's family history includes Canavan disease in her paternal aunt; Cancer in her maternal aunt and paternal aunt; High Cholesterol in her father; Hodgkin's lymphoma in her child;  Other in her mother.  ROS:   Please see the history of present illness.     All other systems reviewed and are negative.   Labs/Other Tests and Data Reviewed:    Recent Labs: 01/28/2019: BUN 17; Creatinine, Ser 0.59; Hemoglobin 12.2; Platelets 223; Potassium 3.8; Sodium 140   Recent Lipid Panel No results found for: CHOL, TRIG, HDL, CHOLHDL, LDLCALC, LDLDIRECT  Wt Readings from Last 3 Encounters:  01/28/19 135 lb (61.2 kg)  01/28/19 135 lb (61.2 kg)  11/30/16 138 lb (62.6 kg)     Objective:    Vital Signs:  There were no vitals taken for this visit.   CONSTITUTIONAL:  Well nourished, well developed female in no acute distress.  EYES: anicteric MOUTH: oral mucosa is pink RESPIRATORY: Normal respiratory effort, symmetric expansion CARDIOVASCULAR: No peripheral edema SKIN: No rash, lesions or ulcers MUSCULOSKELETAL: no digital cyanosis NEURO: Cranial Nerves II-XII grossly intact, moves all extremities PSYCH: Intact judgement and insight.  A&O x 3, Mood/affect appropriate   ASSESSMENT & PLAN:    1.  Chest pain -evaluated in the ER in April with normal EKG and trop -likely related to prior URI a week before onset of symptoms. -no further CP since then  2.  SOB  COVID-19 Education: The signs and symptoms of COVID-19 were discussed with the patient and how to seek care for testing (follow up with PCP or arrange E-visit).  The importance of social distancing was discussed today.  Patient Risk:   After full review of this patient's clinical status, I feel that they are at least moderate risk at this time.  Time:   Today, I have spent *** minutes directly with the patient on *** discussing medical problems including ***.  We also reviewed the symptoms of COVID 19 and the ways to protect against contracting the virus with telehealth technology.  I spent an additional *** minutes reviewing patient's chart including ***.  Medication Adjustments/Labs and Tests Ordered:  Current medicines are reviewed at length with the patient today.  Concerns regarding medicines are outlined above.  Tests Ordered: No orders of the defined types were placed in this encounter.  Medication Changes: No orders of the defined types were placed in this encounter.   Disposition:  Follow up {follow up:15908}  Signed, Fransico Him, MD  04/29/2019 8:41 PM    [facility]

## 2019-04-29 NOTE — Telephone Encounter (Signed)
New Message     Left message to confirm a telephone or video visit appt

## 2019-04-30 ENCOUNTER — Ambulatory Visit: Payer: BLUE CROSS/BLUE SHIELD | Admitting: Cardiology

## 2019-05-22 DIAGNOSIS — Z6826 Body mass index (BMI) 26.0-26.9, adult: Secondary | ICD-10-CM | POA: Diagnosis not present

## 2019-05-22 DIAGNOSIS — Z01419 Encounter for gynecological examination (general) (routine) without abnormal findings: Secondary | ICD-10-CM | POA: Diagnosis not present

## 2019-05-22 DIAGNOSIS — F419 Anxiety disorder, unspecified: Secondary | ICD-10-CM | POA: Diagnosis not present

## 2019-05-22 DIAGNOSIS — Z1231 Encounter for screening mammogram for malignant neoplasm of breast: Secondary | ICD-10-CM | POA: Diagnosis not present

## 2019-05-22 DIAGNOSIS — M810 Age-related osteoporosis without current pathological fracture: Secondary | ICD-10-CM | POA: Diagnosis not present

## 2019-05-27 ENCOUNTER — Other Ambulatory Visit: Payer: Self-pay | Admitting: Obstetrics and Gynecology

## 2019-05-27 DIAGNOSIS — Z13228 Encounter for screening for other metabolic disorders: Secondary | ICD-10-CM | POA: Diagnosis not present

## 2019-05-27 DIAGNOSIS — Z13220 Encounter for screening for lipoid disorders: Secondary | ICD-10-CM | POA: Diagnosis not present

## 2019-05-27 DIAGNOSIS — R928 Other abnormal and inconclusive findings on diagnostic imaging of breast: Secondary | ICD-10-CM

## 2019-05-27 DIAGNOSIS — E559 Vitamin D deficiency, unspecified: Secondary | ICD-10-CM | POA: Diagnosis not present

## 2019-05-27 DIAGNOSIS — Z1329 Encounter for screening for other suspected endocrine disorder: Secondary | ICD-10-CM | POA: Diagnosis not present

## 2019-05-30 ENCOUNTER — Other Ambulatory Visit: Payer: Self-pay

## 2019-05-30 ENCOUNTER — Ambulatory Visit
Admission: RE | Admit: 2019-05-30 | Discharge: 2019-05-30 | Disposition: A | Discharge status: Home / Self Care | Payer: BC Managed Care – PPO | Source: Ambulatory Visit | Attending: Obstetrics and Gynecology | Admitting: Obstetrics and Gynecology

## 2019-05-30 ENCOUNTER — Other Ambulatory Visit: Payer: Self-pay | Admitting: Obstetrics and Gynecology

## 2019-05-30 DIAGNOSIS — N6002 Solitary cyst of left breast: Secondary | ICD-10-CM | POA: Diagnosis not present

## 2019-05-30 DIAGNOSIS — R748 Abnormal levels of other serum enzymes: Secondary | ICD-10-CM

## 2019-05-30 DIAGNOSIS — R928 Other abnormal and inconclusive findings on diagnostic imaging of breast: Secondary | ICD-10-CM

## 2019-06-06 ENCOUNTER — Other Ambulatory Visit: Payer: BC Managed Care – PPO

## 2019-06-06 ENCOUNTER — Ambulatory Visit
Admission: RE | Admit: 2019-06-06 | Discharge: 2019-06-06 | Disposition: A | Discharge status: Home / Self Care | Payer: BC Managed Care – PPO | Source: Ambulatory Visit | Attending: Obstetrics and Gynecology | Admitting: Obstetrics and Gynecology

## 2019-06-06 DIAGNOSIS — B182 Chronic viral hepatitis C: Secondary | ICD-10-CM | POA: Diagnosis not present

## 2019-06-06 DIAGNOSIS — R748 Abnormal levels of other serum enzymes: Secondary | ICD-10-CM

## 2019-06-19 DIAGNOSIS — Z1211 Encounter for screening for malignant neoplasm of colon: Secondary | ICD-10-CM | POA: Diagnosis not present

## 2019-06-19 DIAGNOSIS — Z86010 Personal history of colonic polyps: Secondary | ICD-10-CM | POA: Diagnosis not present

## 2019-06-19 DIAGNOSIS — K5904 Chronic idiopathic constipation: Secondary | ICD-10-CM | POA: Diagnosis not present

## 2019-06-19 DIAGNOSIS — R748 Abnormal levels of other serum enzymes: Secondary | ICD-10-CM | POA: Diagnosis not present

## 2019-07-16 DIAGNOSIS — K621 Rectal polyp: Secondary | ICD-10-CM | POA: Diagnosis not present

## 2019-07-16 DIAGNOSIS — K635 Polyp of colon: Secondary | ICD-10-CM | POA: Diagnosis not present

## 2019-07-16 DIAGNOSIS — D128 Benign neoplasm of rectum: Secondary | ICD-10-CM | POA: Diagnosis not present

## 2019-07-16 DIAGNOSIS — D125 Benign neoplasm of sigmoid colon: Secondary | ICD-10-CM | POA: Diagnosis not present

## 2019-07-16 DIAGNOSIS — Z1211 Encounter for screening for malignant neoplasm of colon: Secondary | ICD-10-CM | POA: Diagnosis not present

## 2019-07-25 DIAGNOSIS — Z5689 Other problems related to employment: Secondary | ICD-10-CM | POA: Diagnosis not present

## 2019-08-04 DIAGNOSIS — M792 Neuralgia and neuritis, unspecified: Secondary | ICD-10-CM | POA: Diagnosis not present

## 2019-08-04 DIAGNOSIS — H00014 Hordeolum externum left upper eyelid: Secondary | ICD-10-CM | POA: Diagnosis not present

## 2019-08-21 DIAGNOSIS — H5213 Myopia, bilateral: Secondary | ICD-10-CM | POA: Diagnosis not present

## 2019-09-10 DIAGNOSIS — I10 Essential (primary) hypertension: Secondary | ICD-10-CM | POA: Diagnosis not present

## 2019-09-10 DIAGNOSIS — F411 Generalized anxiety disorder: Secondary | ICD-10-CM | POA: Diagnosis not present

## 2019-09-10 DIAGNOSIS — G4700 Insomnia, unspecified: Secondary | ICD-10-CM | POA: Diagnosis not present

## 2019-09-10 DIAGNOSIS — F3341 Major depressive disorder, recurrent, in partial remission: Secondary | ICD-10-CM | POA: Diagnosis not present

## 2019-09-19 DIAGNOSIS — Z20828 Contact with and (suspected) exposure to other viral communicable diseases: Secondary | ICD-10-CM | POA: Diagnosis not present

## 2019-10-09 ENCOUNTER — Encounter: Payer: Self-pay | Admitting: Allergy & Immunology

## 2019-10-09 ENCOUNTER — Other Ambulatory Visit: Payer: Self-pay

## 2019-10-09 ENCOUNTER — Ambulatory Visit (INDEPENDENT_AMBULATORY_CARE_PROVIDER_SITE_OTHER): Payer: BC Managed Care – PPO | Admitting: Allergy & Immunology

## 2019-10-09 VITALS — BP 122/96 | HR 73 | Temp 98.1°F | Resp 16 | Ht 60.5 in | Wt 142.8 lb

## 2019-10-09 DIAGNOSIS — J4530 Mild persistent asthma, uncomplicated: Secondary | ICD-10-CM

## 2019-10-09 DIAGNOSIS — J302 Other seasonal allergic rhinitis: Secondary | ICD-10-CM

## 2019-10-09 DIAGNOSIS — J3089 Other allergic rhinitis: Secondary | ICD-10-CM | POA: Diagnosis not present

## 2019-10-09 DIAGNOSIS — M5010 Cervical disc disorder with radiculopathy, unspecified cervical region: Secondary | ICD-10-CM | POA: Diagnosis not present

## 2019-10-09 DIAGNOSIS — L508 Other urticaria: Secondary | ICD-10-CM | POA: Diagnosis not present

## 2019-10-09 NOTE — Patient Instructions (Addendum)
1. Seasonal and perennial allergic rhinitis - Testing today showed: trees, dust mites and cat - Copy of test results provided.  - Avoidance measures provided. - Continue with: antihistamines, as below for the urticaria (hives) - Consider nasal saline rinses 1-2 times daily to remove allergens from the nasal cavities as well as help with mucous clearance (this is especially helpful to do before the nasal sprays are given) - Consider allergy shots as a means of long-term control.  2. Chronic urticaria - Your history does not have any "red flags" such as fevers, joint pains, or permanent skin changes that would be concerning for a more serious cause of hives.  - There were some triggers on the skin testing for environmental allergens today.  - Testing was negative to the most common foods, ruling out 97% of all food allergies. - Add on triamcinolone 0.1% twice daily as needed.  - We will get some labs to rule out serious causes of hives: alpha gal panel, complete blood count, tryptase level, chronic urticaria panel, CMP, ESR, and CRP. - Chronic hives are often times a self limited process and will "burn themselves out" over 6-12 months, although this is not always the case.  - In the meantime, start suppressive dosing of antihistamines:   - Morning: Zyrtec (cetirizine) 10-17m (one or two tablets)  - Evening: Zyrtec (cetirizine) 10-238m(one or two tablets) + Singulair (montelukast) 1047maily - You can change this dosing at home, decreasing the dose as needed or increasing the dosing as needed.  - If you are not tolerating the medications or are tired of taking them every day, we can start treatment with a monthly injectable medication called Xolair.   3. Mild persistent asthma, uncomplicated - Lung testing actually looked great today. - We are adding Singulair for your hives, which can also help with your breathing and prevent you from needing any steroids.  - Spacer sample and demonstration  provided. - Daily controller medication(s): Singulair 54m68mily - Prior to physical activity: albuterol 2 puffs 10-15 minutes before physical activity. - Rescue medications: albuterol 4 puffs every 4-6 hours as needed - Changes during respiratory infections or worsening symptoms: Add on Flovent 154mc39mpuffs twice daily for TWO WEEKS (to avoid the need for systemic steroids such as prednisone) - Asthma control goals:  * Full participation in all desired activities (may need albuterol before activity) * Albuterol use two time or less a week on average (not counting use with activity) * Cough interfering with sleep two time or less a month * Oral steroids no more than once a year * No hospitalizations  4. Return in about 6 weeks (around 11/20/2019). This can be an in-person, a virtual Webex or a telephone follow up visit.   Please inform [facility] visits, hospitalizations, or changes in symptoms. Call us [facility] going to the ED for breathing or allergy symptoms since we might be able to fit you in for a sick visit. Feel free to contact us an[place] with any questions, problems, or concerns.  It was a pleasure to meet you today!  Websites that have reliable patient information: 1. American Academy of Asthma, Allergy, and Immunology: www.aaaai.org 2. Food Allergy Research and Education (FARE): foodallergy.org 3. Mothers of Asthmatics: http://www.asthmacommunitynetwork.org 4. American College of Allergy, Asthma, and Immunology: www.acaai.org  "Like" us onK[place]acebook and Instagram for our latest updates!        Make sure you are registered to vote! If you have  moved or changed any of your contact information, you will need to get this updated before voting!  In some cases, you MAY be able to register to vote online: [email or web address]    Reducing Pollen Exposure  The American Academy of Allergy, Asthma and Immunology suggests the  following steps to reduce your exposure to pollen during allergy seasons.    1. Do not hang sheets or clothing out to dry; pollen may collect on these items. 2. Do not mow lawns or spend time around freshly cut grass; mowing stirs up pollen. 3. Keep windows closed at night.  Keep car windows closed while driving. 4. Minimize morning activities outdoors, a time when pollen counts are usually at their highest. 5. Stay indoors as much as possible when pollen counts or humidity is high and on windy days when pollen tends to remain in the air longer. 6. Use air conditioning when possible.  Many air conditioners have filters that trap the pollen spores. 7. Use a HEPA room air filter to remove pollen form the indoor air you breathe.  Control of Dust Mite Allergen    Dust mites play a major role in allergic asthma and rhinitis.  They occur in environments with high humidity wherever human skin is found.  Dust mites absorb humidity from the atmosphere (ie, they do not drink) and feed on organic matter (including shed human and animal skin).  Dust mites are a microscopic type of insect that you cannot see with the naked eye.  High levels of dust mites have been detected from mattresses, pillows, carpets, upholstered furniture, bed covers, clothes, soft toys and any woven material.  The principal allergen of the dust mite is found in its feces.  A gram of dust may contain 1,000 mites and 250,000 fecal particles.  Mite antigen is easily measured in the air during house cleaning activities.  Dust mites do not bite and do not cause harm to humans, other than by triggering allergies/asthma.    Ways to decrease your exposure to dust mites in your home:  1. Encase mattresses, box springs and pillows with a mite-impermeable barrier or cover   2. Wash sheets, blankets and drapes weekly in hot water (130 F) with detergent and dry them in a dryer on the hot setting.  3. Have the room cleaned frequently with a vacuum  cleaner and a damp dust-mop.  For carpeting or rugs, vacuuming with a vacuum cleaner equipped with a high-efficiency particulate air (HEPA) filter.  The dust mite allergic individual should not be in a room which is being cleaned and should wait 1 hour after cleaning before going into the room. 4. Do not sleep on upholstered furniture (eg, couches).   5. If possible removing carpeting, upholstered furniture and drapery from the home is ideal.  Horizontal blinds should be eliminated in the rooms where the person spends the most time (bedroom, study, television room).  Washable vinyl, roller-type shades are optimal. 6. Remove all non-washable stuffed toys from the bedroom.  Wash stuffed toys weekly like sheets and blankets above.   7. Reduce indoor humidity to less than 50%.  Inexpensive humidity monitors can be purchased at most hardware stores.  Do not use a humidifier as can make the problem worse and are not recommended.  Control of Dog or Cat Allergen  Avoidance is the best way to manage a dog or cat allergy. If you have a dog or cat and are allergic to dog or cats, consider removing the dog  or cat from the home. If you have a dog or cat but don't want to find it a new home, or if your family wants a pet even though someone in the household is allergic, here are some strategies that may help keep symptoms at bay:  1. Keep the pet out of your bedroom and restrict it to only a few rooms. Be advised that keeping the dog or cat in only one room will not limit the allergens to that room. 2. Don't pet, hug or kiss the dog or cat; if you do, wash your hands with soap and water. 3. High-efficiency particulate air (HEPA) cleaners run continuously in a bedroom or living room can reduce allergen levels over time. 4. Regular use of a high-efficiency vacuum cleaner or a central vacuum can reduce allergen levels. 5. Giving your dog or cat a bath at least once a week can reduce airborne allergen.

## 2019-10-09 NOTE — Progress Notes (Signed)
NEW PATIENT  Date of Service/Encounter:  10/09/19  Referring provider: Jenny Reichmann, PA-C   Assessment:   Mild persistent asthma, uncomplicated  Seasonal and perennial allergic rhinitis  Chronic urticaria - unknown trigger with workup pending today  Recurrent infections - possible work up at the next visit  Plan/Recommendations:   1. Seasonal and perennial allergic rhinitis - Testing today showed: trees, dust mites and cat - Copy of test results provided.  - Avoidance measures provided. - Continue with: antihistamines, as below for the urticaria (hives) - Consider nasal saline rinses 1-2 times daily to remove allergens from the nasal cavities as well as help with mucous clearance (this is especially helpful to do before the nasal sprays are given) - Consider allergy shots as a means of long-term control.  2. Chronic urticaria - Your history does not have any "red flags" such as fevers, joint pains, or permanent skin changes that would be concerning for a more serious cause of hives.  - There were some triggers on the skin testing for environmental allergens today.  - Testing was negative to the most common foods, ruling out 97% of all food allergies. - Add on triamcinolone 0.1% twice daily as needed.  - We will get some labs to rule out serious causes of hives: alpha gal panel, complete blood count, tryptase level, chronic urticaria panel, CMP, ESR, and CRP. - Chronic hives are often times a self limited process and will "burn themselves out" over 6-12 months, although this is not always the case.  - In the meantime, start suppressive dosing of antihistamines:   - Morning: Zyrtec (cetirizine) 10-11m (one or two tablets)  - Evening: Zyrtec (cetirizine) 10-256m(one or two tablets) + Singulair (montelukast) 1078maily - You can change this dosing at home, decreasing the dose as needed or increasing the dosing as needed.  - If you are not tolerating the medications or  are tired of taking them every day, we can start treatment with a monthly injectable medication called Xolair.   3. Mild persistent asthma, uncomplicated - Lung testing actually looked great today. - We are adding Singulair for your hives, which can also help with your breathing and prevent you from needing any steroids.  - Spacer sample and demonstration provided. - Daily controller medication(s): Singulair 38m76mily - Prior to physical activity: albuterol 2 puffs 10-15 minutes before physical activity. - Rescue medications: albuterol 4 puffs every 4-6 hours as needed - Changes during respiratory infections or worsening symptoms: Add on Flovent 138mc19mpuffs twice daily for TWO WEEKS (to avoid the need for systemic steroids such as prednisone) - Asthma control goals:  * Full participation in all desired activities (may need albuterol before activity) * Albuterol use two time or less a week on average (not counting use with activity) * Cough interfering with sleep two time or less a month * Oral steroids no more than once a year * No hospitalizations  4. Follow up in six weeks or earlier if needed. This can be an in-person, a virtual Webex or a telephone follow up visit.   Subjective:   Veronica Booth 59 y.41 female presenting today for evaluation of  Chief Complaint  Patient presents with  . Allergic Reaction    breaks out in hives, itchy, scaley, peels  . Eczema    hands    LesliAlma Mohiuddina history of the following: Patient Active Problem List   Diagnosis Date Noted  .  SOB (shortness of breath) 04/29/2019  . Chest pain of uncertain etiology 16/10/930  . Nonallopathic lesion of cervical region 08/04/2016  . Nonallopathic lesion of thoracic region 08/04/2016  . Nonallopathic lesion of sacral region 08/04/2016  . Nonallopathic lesion of lumbosacral region 08/04/2016  . Trigger point of left shoulder region 07/14/2016  . Poor posture 07/14/2016  .  Cervical disc disorder with radiculopathy of cervical region 07/14/2016  . Cervical pain (neck) 03/31/2016  . Right knee pain 03/31/2016    History obtained from: chart review and patient.  Veronica Booth was referred by Jenny Reichmann, PA-C.     Veronica Booth is a 59 y.o. female presenting for an evaluation of chronic urticaria.   She reports that hives started around one year ago. They are intermittent and mostly occur on her face and neck. They are very itchy and the skin peels off. Symptoms last around 2-3 days until it diminishes. "She ices them with frozen pea bags and she uses Benadryl. She does have cetirizine but she does not take it every day. She typically uses it only seasonally.   Asthma/Respiratory Symptom History: She has never been diagnosed with asthma, but she has received breathing treatments in the ED and Urgent Care facilities. She does receive prednisone fairly routine basis. She gets prednisone fairly regularly.   Allergic Rhinitis Symptom History: She does have a history of seasonal allergies. Symptoms start in May or April. She typically takes cetirizine dailry from April through September. She has used Nasacort as needed. Most of her symptoms are centered on her nose and eyes. She does report some itching of her throat as well. She does tend to get URIs when her symptoms become particularly bad.   She tells me that she has had pneumonia three times. These are CXR diagnosed pneumonias at [facility]. She has had these diagnosed at Urgent Care as well.  She never needed IV antibiotics for these pneumonias.  She has never seen pulmonology for these pneumonias.  She does get treated with antibiotics and steroids at each time.  I do not have access to these images, however. She otherwise has no problems with infections at all. There is no family history of immunodeficiency.   She did have eczema as a child. But this has not been a problem in several years.    Otherwise, there is no history of other atopic diseases, including food allergies, drug allergies, stinging insect allergies, eczema or contact dermatitis. There is no significant infectious history. Vaccinations are up to date.    Past Medical History: Patient Active Problem List   Diagnosis Date Noted  . SOB (shortness of breath) 04/29/2019  . Chest pain of uncertain etiology 35/57/3220  . Nonallopathic lesion of cervical region 08/04/2016  . Nonallopathic lesion of thoracic region 08/04/2016  . Nonallopathic lesion of sacral region 08/04/2016  . Nonallopathic lesion of lumbosacral region 08/04/2016  . Trigger point of left shoulder region 07/14/2016  . Poor posture 07/14/2016  . Cervical disc disorder with radiculopathy of cervical region 07/14/2016  . Cervical pain (neck) 03/31/2016  . Right knee pain 03/31/2016    Medication List:  Allergies as of 10/09/2019   No Known Allergies     Medication List       Accurate as of October 09, 2019  4:36 PM. If you have any questions, ask your nurse or doctor.        STOP taking these medications   Pen[place]aid 2 % Soln Generic drug: Diclofenac Sodium  Stopped by: Valentina Shaggy, MD     TAKE these medications   albuterol 108 (90 Base) MCG/ACT inhaler Commonly known as: VENTOLIN HFA Inhale 1 puff into the lungs every 6 (six) hours as needed for wheezing or shortness of breath.   alendronate 70 MG tablet Commonly known as: FOSAMAX   ALPRAZolam 0.25 MG tablet Commonly known as: XANAX   hydrOXYzine 50 MG tablet Commonly known as: ATARAX/VISTARIL   lisinopril 10 MG tablet Commonly known as: ZESTRIL Take 10 mg by mouth daily.   ZYRTEC-D PO Take by mouth.       Birth History: non-contributory  Developmental History: non-contributory  Past Surgical History: Past Surgical History:  Procedure Laterality Date  . CESAREAN SECTION    . TONSILLECTOMY     59 years of age     Family History: Family History   Problem Relation Age of Onset  . Other Mother        ACCIDENTALLY  . High Cholesterol Father   . Canavan disease Paternal Aunt   . Cancer Paternal Aunt        BREAST CANCER  . Cancer Maternal Aunt        BREAST CANCER  . Hodgkin's lymphoma Child      Social History: Madyn lives at home in a house that is 60 years old.  There is no mildew damage.  There is wood in the main living areas and carpeting in the bedrooms.  They have electric heating and central cooling.  There are dogs inside of the home.  There are dust mite covers on the bed, but not the pillows.  There is no tobacco exposure.   Review of Systems  Constitutional: Negative.  Negative for chills, fever, malaise/fatigue and weight loss.  HENT: Negative.  Negative for congestion, ear discharge and ear pain.        Positive for postnasal drip.  Eyes: Negative for pain, discharge and redness.  Respiratory: Negative for cough, sputum production, shortness of breath and wheezing.   Cardiovascular: Negative.  Negative for chest pain and palpitations.  Gastrointestinal: Negative for abdominal pain, constipation, diarrhea, heartburn, nausea and vomiting.  Skin: Positive for itching and rash.  Neurological: Negative for dizziness and headaches.  Endo/Heme/Allergies: Negative for environmental allergies. Does not bruise/bleed easily.       Objective:   Blood pressure (!) 122/96, pulse 73, temperature 98.1 F (36.7 C), temperature source Temporal, resp. rate 16, height 5' 0.5" (1.537 m), weight 142 lb 12.8 oz (64.8 kg), SpO2 98 %. Body mass index is 27.43 kg/m.   Physical Exam:   Physical Exam  Constitutional: She appears well-developed.  HENT:  Head: Normocephalic and atraumatic.  Right Ear: Tympanic membrane, external ear and ear canal normal. No drainage, swelling or tenderness. Tympanic membrane is not injected, not scarred, not erythematous, not retracted and not bulging.  Left Ear: Tympanic membrane, external ear  and ear canal normal. No drainage, swelling or tenderness. Tympanic membrane is not injected, not scarred, not erythematous, not retracted and not bulging.  Nose: No mucosal edema, rhinorrhea, nasal deformity or septal deviation. No epistaxis. Right sinus exhibits no maxillary sinus tenderness and no frontal sinus tenderness. Left sinus exhibits no maxillary sinus tenderness and no frontal sinus tenderness.  Mouth/Throat: Uvula is midline and oropharynx is clear and moist. Mucous membranes are not pale and not dry.  Eyes: Pupils are equal, round, and reactive to light. Conjunctivae and EOM are normal. Right eye exhibits no chemosis and no discharge. Left  eye exhibits no chemosis and no discharge. Right conjunctiva is not injected. Left conjunctiva is not injected.  Cardiovascular: Normal rate, regular rhythm and normal heart sounds.  Respiratory: Effort normal and breath sounds normal. No accessory muscle usage. No tachypnea. No respiratory distress. She has no wheezes. She has no rhonchi. She has no rales. She exhibits no tenderness.  GI: There is no abdominal tenderness. There is no rebound and no guarding.  Lymphadenopathy:       Head (right side): No submandibular, no tonsillar and no occipital adenopathy present.       Head (left side): No submandibular, no tonsillar and no occipital adenopathy present.    She has no cervical adenopathy.  Neurological: She is alert.  Skin: No abrasion, no petechiae and no rash noted. Rash is not papular, not vesicular and not urticarial. No erythema. No pallor.  Psychiatric: She has a normal mood and affect.     Diagnostic studies:    Spirometry: results normal (FEV1: 2.43/105%, FVC: 2.96/99%, FEV1/FVC: 82%).    Spirometry consistent with normal pattern.    Allergy Studies: none  Airborne Adult Perc - 10/09/19 1400    Time Antigen Placed  0230    Allergen Manufacturer  Lavella Hammock    Location  Back    Number of Test  59    1. Control-Buffer 50% Glycerol   Negative    2. Control-Histamine 1 mg/ml  2+    3. Albumin saline  Negative    4. [place]  Negative    5. [place]  Negative    6. [place]  Negative    7. Fisher Blue  Negative    8. Meadow Fescue  Negative    9. Perennial Rye  Negative    10. Sweet Vernal  Negative    11. Timothy  Negative    12. Cocklebur  Negative    13. Burweed Marshelder  Negative    14. Ragweed, short  Negative    15. Ragweed, Giant  Negative    16. Plantain,  English  Negative    17. Lamb's Quarters  Negative    18. Sheep Sorrell  Negative    19. Rough Pigweed  Negative    20. Marsh Elder, Rough  Negative    21. Mugwort, Common  Negative    22. Ash mix  Negative    23. Birch mix  2+    24. Beech American  2+    25. Box, Elder  2+    26. Cedar, red  Negative    27. Cottonwood, [place]  Negative    28. Elm mix  Negative    29. Hickory mix  3+    30. Maple mix  Negative    31. Oak, [place] mix  3+    32. Pecan Pollen  Negative    33. Pine mix  Negative    34. Sycamore Eastern  Negative    35. [place], Black Pollen  2+    36. Alternaria alternata  Negative    37. Cladosporium Herbarum  Negative    38. Aspergillus mix  Negative    39. Penicillium mix  Negative    40. Bipolaris sorokiniana (Helminthosporium)  Negative    41. Drechslera spicifera (Curvularia)  Negative    42. Mucor plumbeus  Negative    43. Fusarium moniliforme  Negative    44. Aureobasidium pullulans (pullulara)  Negative    45. Rhizopus oryzae  Negative    46. Botrytis cinera  Negative  47. Epicoccum nigrum  Negative    48. Phoma betae  Negative    49. Candida Albicans  Negative    50. Trichophyton mentagrophytes  Negative    51. Mite, D Farinae  5,000 AU/ml  2+    52. Mite, D Pteronyssinus  5,000 AU/ml  Negative    53. Cat Hair 10,000 BAU/ml  3+    54.  Dog Epithelia  Negative    55. Mixed Feathers  Negative    56. Horse Epithelia  Negative    57. Cockroach, German  Negative    58. Mouse  Negative    59. Tobacco Leaf   Negative     Food Perc - 10/09/19 1400    Time Antigen Placed  0230    Allergen Manufacturer  Lavella Hammock    Location  Back    Number of allergen test  10    1. Peanut  Negative    2. Soybean food  Negative    3. Wheat, whole  Negative    4. Sesame  Negative    5. Milk, cow  Negative    6. Egg White, chicken  Negative    7. Casein  Negative    8. Shellfish mix  Negative    9. Fish mix  Negative    10. Cashew  Negative            Salvatore Marvel, MD [facility]

## 2019-10-10 LAB — CBC WITH DIFFERENTIAL/PLATELET
Basophils Absolute: 0 10*3/uL (ref 0.0–0.2)
Basos: 1 %
EOS (ABSOLUTE): 0.3 10*3/uL (ref 0.0–0.4)
Eos: 5 %
Hematocrit: 41.8 % (ref 34.0–46.6)
Hemoglobin: 13.5 g/dL (ref 11.1–15.9)
Immature Grans (Abs): 0 10*3/uL (ref 0.0–0.1)
Immature Granulocytes: 0 %
Lymphocytes Absolute: 1.3 10*3/uL (ref 0.7–3.1)
Lymphs: 23 %
MCH: 28.5 pg (ref 26.6–33.0)
MCHC: 32.3 g/dL (ref 31.5–35.7)
MCV: 88 fL (ref 79–97)
Monocytes Absolute: 0.6 10*3/uL (ref 0.1–0.9)
Monocytes: 10 %
Neutrophils Absolute: 3.3 10*3/uL (ref 1.4–7.0)
Neutrophils: 61 %
Platelets: 233 10*3/uL (ref 150–450)
RBC: 4.73 x10E6/uL (ref 3.77–5.28)
RDW: 12.4 % (ref 11.7–15.4)
WBC: 5.4 10*3/uL (ref 3.4–10.8)

## 2019-10-16 LAB — CMP14+EGFR
ALT: 54 IU/L — ABNORMAL HIGH (ref 0–32)
AST: 31 IU/L (ref 0–40)
Albumin/Globulin Ratio: 1.7 (ref 1.2–2.2)
Albumin: 4.5 g/dL (ref 3.8–4.9)
Alkaline Phosphatase: 177 IU/L — ABNORMAL HIGH (ref 39–117)
BUN/Creatinine Ratio: 21 (ref 9–23)
BUN: 15 mg/dL (ref 6–24)
Bilirubin Total: 0.3 mg/dL (ref 0.0–1.2)
CO2: 24 mmol/L (ref 20–29)
Calcium: 9.5 mg/dL (ref 8.7–10.2)
Chloride: 104 mmol/L (ref 96–106)
Creatinine, Ser: 0.72 mg/dL (ref 0.57–1.00)
GFR calc Af Amer: 106 mL/min/{1.73_m2} (ref >59)
GFR calc non Af Amer: 92 mL/min/{1.73_m2} (ref >59)
Globulin, Total: 2.7 g/dL (ref 1.5–4.5)
Glucose: 86 mg/dL (ref 65–99)
Potassium: 4.6 mmol/L (ref 3.5–5.2)
Sodium: 141 mmol/L (ref 134–144)
Total Protein: 7.2 g/dL (ref 6.0–8.5)

## 2019-10-16 LAB — ALPHA-GAL PANEL
Alpha Gal IgE*: <0.1 kU/L (ref <0.10)
Beef (Bos spp) IgE: <0.1 kU/L (ref <0.35)
Class Interpretation: 0
Class Interpretation: 0
Class Interpretation: 0
Lamb/Mutton (Ovis spp) IgE: <0.1 kU/L (ref <0.35)
Pork (Sus spp) IgE: <0.1 kU/L (ref <0.35)

## 2019-10-16 LAB — THYROID ANTIBODIES
Thyroglobulin Antibody: <1 IU/mL (ref 0.0–0.9)
Thyroperoxidase Ab SerPl-aCnc: 17 IU/mL (ref 0–34)

## 2019-10-16 LAB — C-REACTIVE PROTEIN: CRP: 2 mg/L (ref 0–10)

## 2019-10-16 LAB — SEDIMENTATION RATE: Sed Rate: 20 mm/hr (ref 0–40)

## 2019-10-16 LAB — TRYPTASE: Tryptase: 7.5 ug/L (ref 2.2–13.2)

## 2019-10-16 LAB — ANA W/REFLEX IF POSITIVE: Anti Nuclear Antibody (ANA): NEGATIVE

## 2019-10-16 LAB — CHRONIC URTICARIA: cu index: 2.8 (ref <10)

## 2019-10-17 ENCOUNTER — Telehealth: Payer: Self-pay

## 2019-10-17 MED ORDER — MONTELUKAST SODIUM 10 MG PO TABS: 10.0000 mg | ORAL_TABLET | Freq: Every day | ORAL | 5 refills | Status: DC

## 2019-10-17 MED ORDER — FLOVENT HFA 110 MCG/ACT IN AERO: 2.0000 | INHALATION_SPRAY | Freq: Two times a day (BID) | RESPIRATORY_TRACT | 5 refills | Status: AC

## 2019-10-17 MED ORDER — ALBUTEROL SULFATE HFA 108 (90 BASE) MCG/ACT IN AERS: 2.0000 | INHALATION_SPRAY | RESPIRATORY_TRACT | 1 refills | Status: DC | PRN

## 2019-10-17 MED ORDER — TRIAMCINOLONE ACETONIDE 0.1 % EX OINT: 1.0000 "application " | TOPICAL_OINTMENT | Freq: Two times a day (BID) | CUTANEOUS | 2 refills | Status: DC

## 2019-10-17 NOTE — Telephone Encounter (Signed)
Pt calling asking about prescriptions from visit on 10/09/2019.  Prescriptions sent in.  Pharmacy verified.

## 2019-11-14 IMAGING — US US ABDOMEN LIMITED
2 series · 13 of 25 positions shown · non-contrast
Comparison: None.
COMPARISON: None.

Addendum:
CLINICAL DATA: Chronic hepatitis-C.

EXAM:
ULTRASOUND ABDOMEN LIMITED RIGHT UPPER QUADRANT

[Series 1: us abdomen limited · 0.20mm/px · 12 of 39 slices shown (1 of 2)]
[im 1/39]
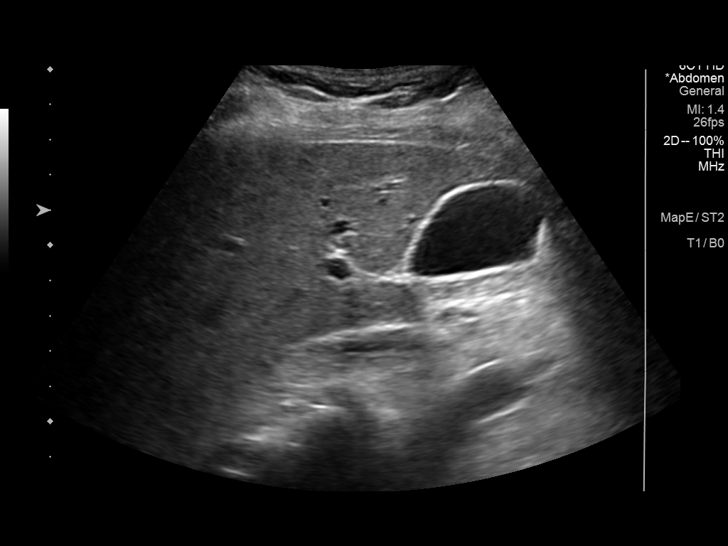
[im 4/39]
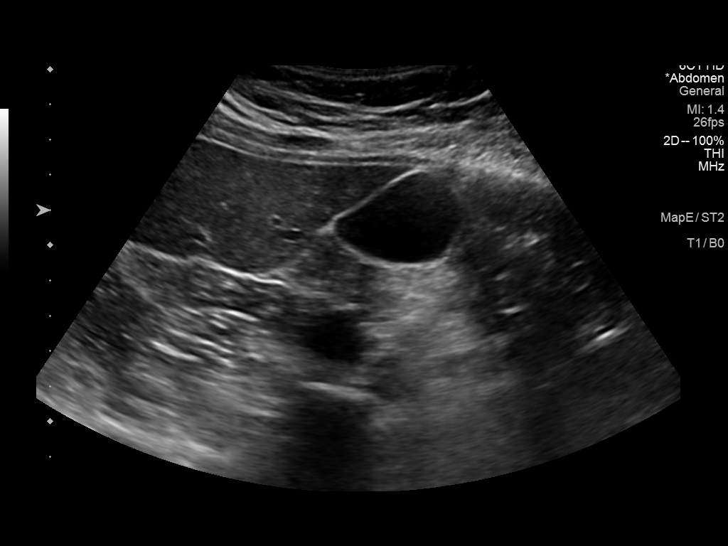
[im 7/39]
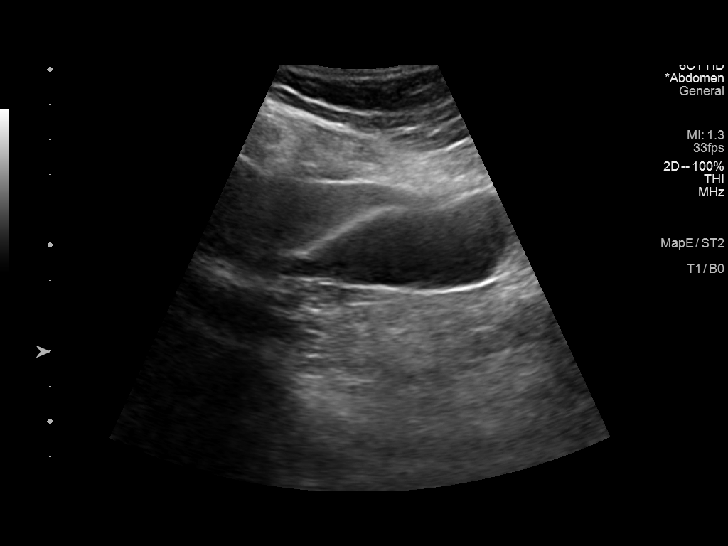
[im 10/39]
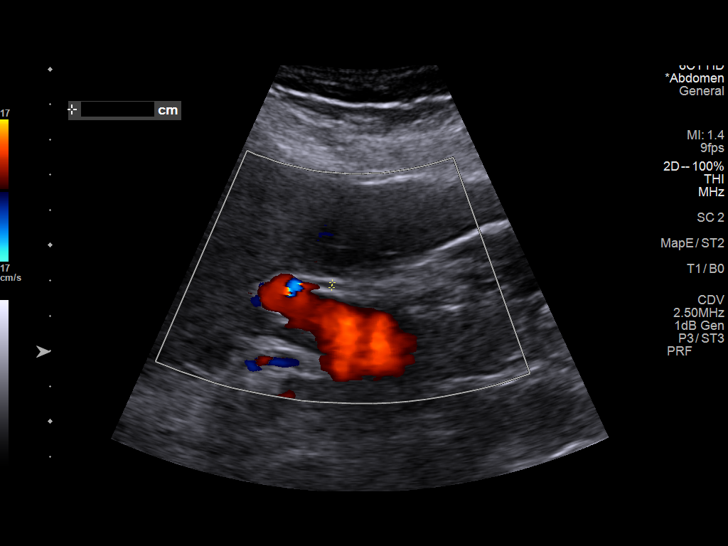
[im 14/39]
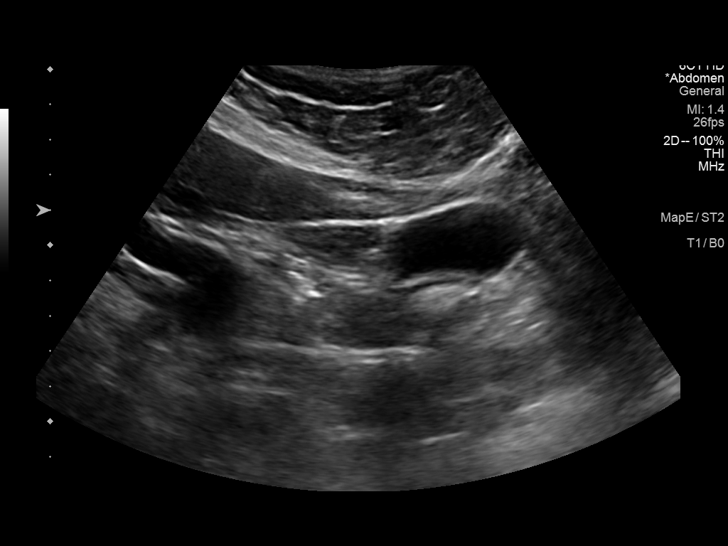
[im 17/39]
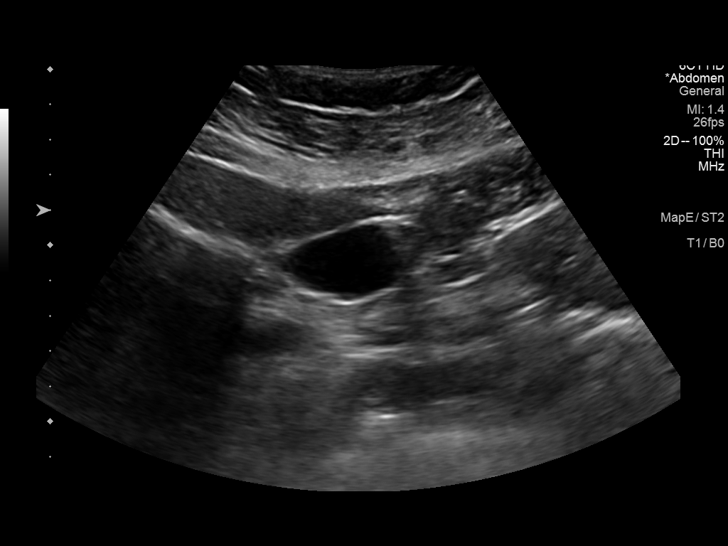
[im 20/39]
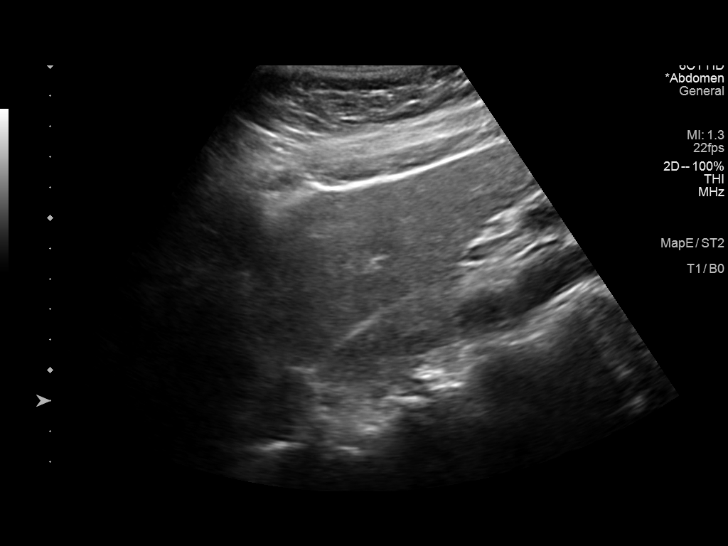
[im 24/39]
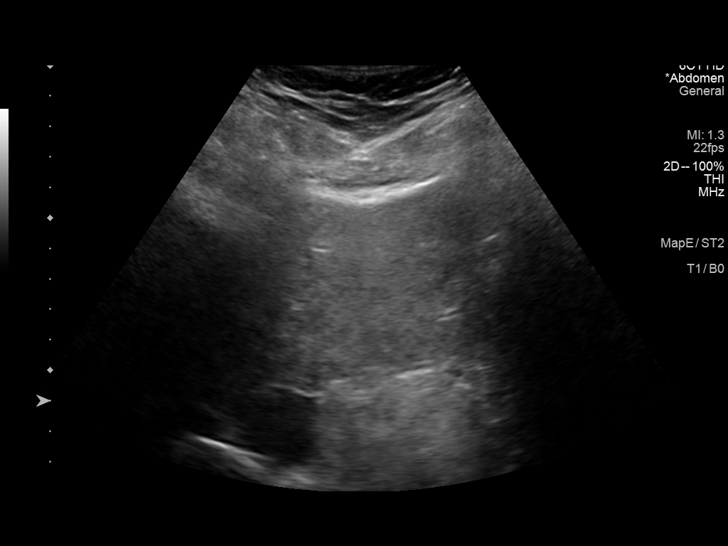
[im 27/39]
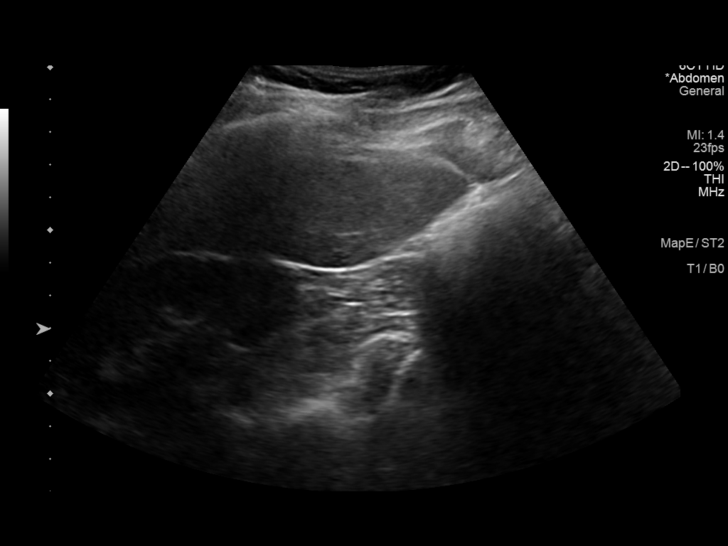
[im 30/39]
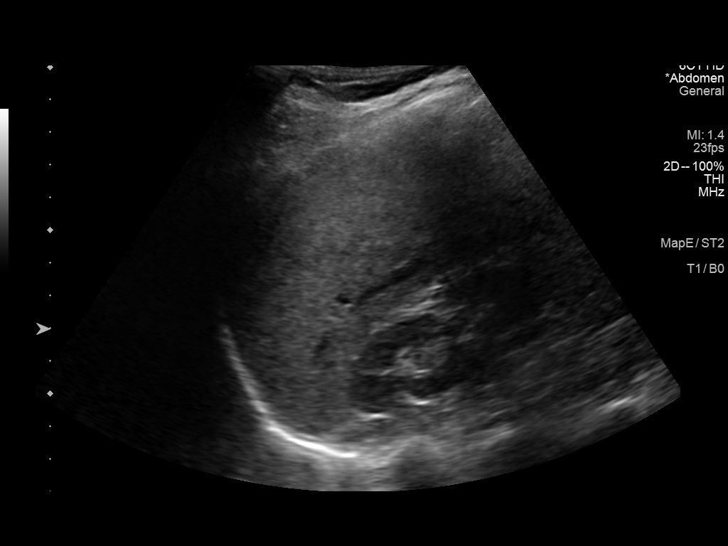
[im 34/39]
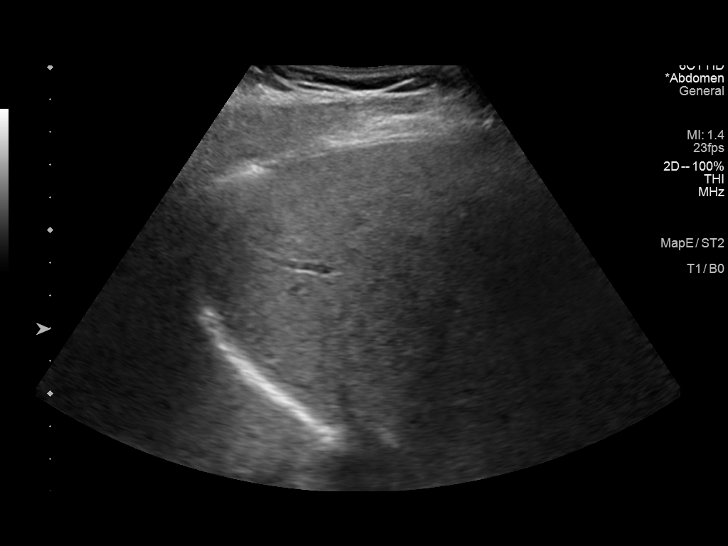
[im 37/39]
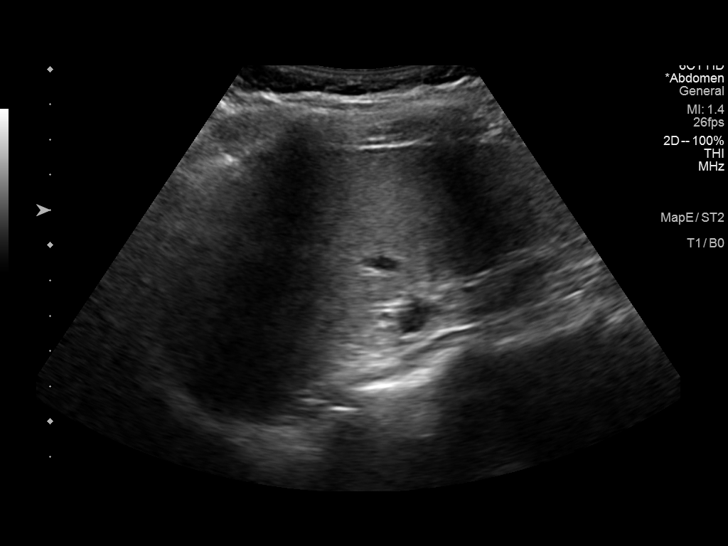

[Series 2001: us abdomen limited · 0.20mm/px · 1 of 1 slices shown (2 of 2)]
[im 1/1]
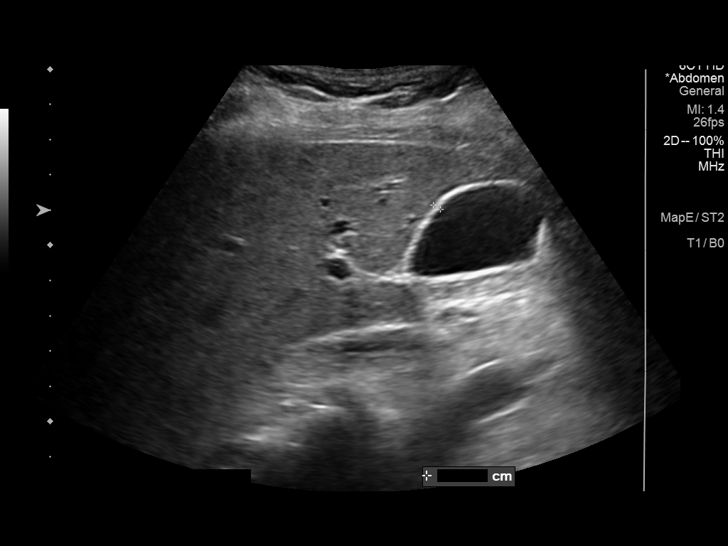

[13 of 25 positions shown; findings below may reference images not displayed]

FINDINGS: Gallbladder:

No gallstones or wall thickening visualized. No sonographic Murphy
sign noted by sonographer.

Common bile duct:

Diameter: 2 mm

Liver:

No focal lesion identified. Within normal limits in parenchymal
echogenicity. Portal vein is patent on color Doppler imaging with
normal direction of blood flow towards the liver.

Other: None.
IMPRESSION: Normal right upper quadrant abdominal ultrasound. No morphologic
changes of cirrhosis or focal hepatic lesions identified.

ADDENDUM:
Correction to clinical data: The office called to state that the
patient does not in fact have chronic hepatitis-C. The indication
for the study was elevated liver enzymes.

*** End of Addendum ***
FINDINGS: Gallbladder:

No gallstones or wall thickening visualized. No sonographic Murphy
sign noted by sonographer.

Common bile duct:

Diameter: 2 mm

Liver:

No focal lesion identified. Within normal limits in parenchymal
echogenicity. Portal vein is patent on color Doppler imaging with
normal direction of blood flow towards the liver.

Other: None.
IMPRESSION: Normal right upper quadrant abdominal ultrasound. No morphologic
changes of cirrhosis or focal hepatic lesions identified.

## 2019-11-20 ENCOUNTER — Ambulatory Visit: Payer: BC Managed Care – PPO | Admitting: Allergy & Immunology

## 2019-12-19 DIAGNOSIS — Z20828 Contact with and (suspected) exposure to other viral communicable diseases: Secondary | ICD-10-CM | POA: Diagnosis not present

## 2020-03-30 DIAGNOSIS — G4700 Insomnia, unspecified: Secondary | ICD-10-CM | POA: Diagnosis not present

## 2020-03-30 DIAGNOSIS — I10 Essential (primary) hypertension: Secondary | ICD-10-CM | POA: Diagnosis not present

## 2020-03-30 DIAGNOSIS — F411 Generalized anxiety disorder: Secondary | ICD-10-CM | POA: Diagnosis not present

## 2020-03-30 DIAGNOSIS — E559 Vitamin D deficiency, unspecified: Secondary | ICD-10-CM | POA: Diagnosis not present

## 2020-04-14 DIAGNOSIS — L820 Inflamed seborrheic keratosis: Secondary | ICD-10-CM | POA: Diagnosis not present

## 2020-04-14 DIAGNOSIS — D225 Melanocytic nevi of trunk: Secondary | ICD-10-CM | POA: Diagnosis not present

## 2020-06-15 DIAGNOSIS — Z6828 Body mass index (BMI) 28.0-28.9, adult: Secondary | ICD-10-CM | POA: Diagnosis not present

## 2020-06-15 DIAGNOSIS — Z01419 Encounter for gynecological examination (general) (routine) without abnormal findings: Secondary | ICD-10-CM | POA: Diagnosis not present

## 2020-06-15 DIAGNOSIS — Z1231 Encounter for screening mammogram for malignant neoplasm of breast: Secondary | ICD-10-CM | POA: Diagnosis not present

## 2020-12-08 DIAGNOSIS — J4520 Mild intermittent asthma, uncomplicated: Secondary | ICD-10-CM | POA: Diagnosis not present

## 2020-12-08 DIAGNOSIS — G4700 Insomnia, unspecified: Secondary | ICD-10-CM | POA: Diagnosis not present

## 2020-12-08 DIAGNOSIS — I10 Essential (primary) hypertension: Secondary | ICD-10-CM | POA: Diagnosis not present

## 2020-12-08 DIAGNOSIS — F411 Generalized anxiety disorder: Secondary | ICD-10-CM | POA: Diagnosis not present

## 2020-12-17 DIAGNOSIS — E785 Hyperlipidemia, unspecified: Secondary | ICD-10-CM | POA: Diagnosis not present

## 2020-12-17 DIAGNOSIS — E559 Vitamin D deficiency, unspecified: Secondary | ICD-10-CM | POA: Diagnosis not present

## 2021-02-02 ENCOUNTER — Encounter (HOSPITAL_BASED_OUTPATIENT_CLINIC_OR_DEPARTMENT_OTHER): Payer: Self-pay | Admitting: Emergency Medicine

## 2021-02-02 ENCOUNTER — Other Ambulatory Visit (HOSPITAL_BASED_OUTPATIENT_CLINIC_OR_DEPARTMENT_OTHER): Payer: Self-pay

## 2021-02-02 ENCOUNTER — Emergency Department (HOSPITAL_BASED_OUTPATIENT_CLINIC_OR_DEPARTMENT_OTHER)
Admission: EM | Admit: 2021-02-02 | Discharge: 2021-02-02 | Disposition: A | Discharge status: Home / Self Care | Payer: BC Managed Care – PPO | Attending: Emergency Medicine | Admitting: Emergency Medicine

## 2021-02-02 ENCOUNTER — Other Ambulatory Visit: Payer: Self-pay

## 2021-02-02 DIAGNOSIS — L509 Urticaria, unspecified: Secondary | ICD-10-CM

## 2021-02-02 DIAGNOSIS — Z79899 Other long term (current) drug therapy: Secondary | ICD-10-CM | POA: Insufficient documentation

## 2021-02-02 DIAGNOSIS — Z87891 Personal history of nicotine dependence: Secondary | ICD-10-CM | POA: Insufficient documentation

## 2021-02-02 DIAGNOSIS — U071 COVID-19: Secondary | ICD-10-CM

## 2021-02-02 DIAGNOSIS — R059 Cough, unspecified: Secondary | ICD-10-CM | POA: Diagnosis not present

## 2021-02-02 DIAGNOSIS — J069 Acute upper respiratory infection, unspecified: Secondary | ICD-10-CM | POA: Insufficient documentation

## 2021-02-02 MED ORDER — PREDNISONE 50 MG PO TABS
50.0000 mg | ORAL_TABLET | Freq: Every day | ORAL | 0 refills | Status: DC
  Filled 2021-02-02: qty 5, 5d supply, fill #0

## 2021-02-02 MED ORDER — HYDROXYZINE HCL 25 MG PO TABS
25.0000 mg | ORAL_TABLET | Freq: Three times a day (TID) | ORAL | 0 refills | Status: DC | PRN
  Filled 2021-02-02: qty 60, 20d supply, fill #0

## 2021-02-02 NOTE — ED Triage Notes (Signed)
Reports rash started 2 days ago , pruritis, redness and swelling  Bilateral arm and bilateral thighs and legs. Cough , covid + 5 days ago. Unknown allergen

## 2021-02-02 NOTE — ED Provider Notes (Signed)
[facility] DEPT Provider Note   CSN: [id number] Arrival date & time: 02/02/21  1102     History Chief Complaint  Patient presents with  . Rash    Veronica Booth is a 60 y.o. female.  She got diagnosed with COVID 6 days ago.  2 days ago she started with hives on her arms legs and trunk.  Very itchy.  Has tried some Benadryl without improvement.  Only using some Mucinex cold and flu medication.  No other new meds.  Mild cough and head congestion.  No shortness of breath.  The history is provided by the patient.  Rash Location:  Torso, shoulder/arm and leg Quality: itchiness and redness   Severity:  Moderate Onset quality:  Gradual Duration:  2 days Timing:  Constant Progression:  Unchanged Relieved by:  Nothing Worsened by:  Nothing Ineffective treatments:  Antihistamines Associated symptoms: fatigue, myalgias, sore throat and URI   Associated symptoms: no abdominal pain, no headaches, no nausea, no shortness of breath and not vomiting        Past Medical History:  Diagnosis Date  . Body mass index (bmi) 25.0-25.9, adult   . Chest pain   . Cough   . Deep dyspareunia   . DJD (degenerative joint disease) of cervical spine   . Eczema   . Elevated blood pressure reading without diagnosis of hypertension   . Heart palpitations   . History of tobacco abuse   . Insomnia   . Light headedness   . Low vitamin D level   . Right hand paresthesia   . Urinary frequency   . Urticaria     Patient Active Problem List   Diagnosis Date Noted  . SOB (shortness of breath) 04/29/2019  . Chest pain of uncertain etiology 54/65/0354  . Nonallopathic lesion of cervical region 08/04/2016  . Nonallopathic lesion of thoracic region 08/04/2016  . Nonallopathic lesion of sacral region 08/04/2016  . Nonallopathic lesion of lumbosacral region 08/04/2016  . Trigger point of left shoulder region 07/14/2016  . Poor posture 07/14/2016  . Cervical disc disorder  with radiculopathy of cervical region 07/14/2016  . Cervical pain (neck) 03/31/2016  . Right knee pain 03/31/2016    Past Surgical History:  Procedure Laterality Date  . CESAREAN SECTION    . TONSILLECTOMY     60 years of age     OB History   No obstetric history on file.     Family History  Problem Relation Age of Onset  . Other Mother        ACCIDENTALLY  . High Cholesterol Father   . Canavan disease Paternal Aunt   . Cancer Paternal Aunt        BREAST CANCER  . Cancer Maternal Aunt        BREAST CANCER  . Hodgkin's lymphoma Child     Social History   Tobacco Use  . Smoking status: Former Smoker    Types: Cigarettes    Quit date: 10/02/2008    Years since quitting: 12.3  . Smokeless tobacco: Never Used  Vaping Use  . Vaping Use: Never used  Substance Use Topics  . Alcohol use: No    Alcohol/week: 0.0 standard drinks  . Drug use: No    Home Medications Prior to Admission medications   Medication Sig Start Date End Date Taking? Authorizing Provider  albuterol (VENTOLIN HFA) 108 (90 Base) MCG/ACT inhaler Inhale 1 puff into the lungs every 6 (six) hours as needed for  wheezing or shortness of breath.    [provider]  albuterol (VENTOLIN HFA) 108 (90 Base) MCG/ACT inhaler Inhale 2 puffs into the lungs every 4 (four) hours as needed. 10/17/19   Valentina Shaggy, MD  alendronate [facility]) 70 MG tablet  05/22/19   [provider]  ALPRAZolam Duanne Moron) 0.25 MG tablet  05/22/19   [provider]  Cetirizine-Pseudoephedrine (ZYRTEC-D PO) Take by mouth.    [provider]  fluticasone (FLOVENT HFA) 110 MCG/ACT inhaler Inhale 2 puffs into the lungs 2 (two) times daily. 10/17/19   Valentina Shaggy, MD  hydrOXYzine (ATARAX/VISTARIL) 50 MG tablet  05/14/19   [provider]  lisinopril (ZESTRIL) 10 MG tablet Take 10 mg by mouth daily.    [provider]  montelukast (SINGULAIR) 10 MG tablet Take 1 tablet (10 mg total)  by mouth at bedtime. 10/17/19   Valentina Shaggy, MD  triamcinolone ointment (KENALOG) 0.1 % Apply 1 application topically 2 (two) times daily. 10/17/19   Valentina Shaggy, MD    Allergies    Patient has no known allergies.  Review of Systems   Review of Systems  Constitutional: Positive for fatigue.  HENT: Positive for sinus pressure and sore throat.   Eyes: Negative for visual disturbance.  Respiratory: Negative for shortness of breath.   Cardiovascular: Negative for chest pain.  Gastrointestinal: Negative for abdominal pain, nausea and vomiting.  Genitourinary: Negative for dysuria.  Musculoskeletal: Positive for myalgias.  Skin: Positive for rash.  Neurological: Negative for headaches.    Physical Exam Updated Vital Signs BP (!) 132/91 (BP Location: Right Arm)   Pulse 62   Temp 98.3 F (36.8 C) (Oral)   Resp 18   LMP 08/02/2016   SpO2 98%   Physical Exam Vitals and nursing note reviewed.  Constitutional:      General: She is not in acute distress.    Appearance: She is well-developed.  HENT:     Head: Normocephalic and atraumatic.  Eyes:     Conjunctiva/sclera: Conjunctivae normal.  Cardiovascular:     Rate and Rhythm: Normal rate and regular rhythm.     Heart sounds: No murmur heard.   Pulmonary:     Effort: Pulmonary effort is normal. No respiratory distress.     Breath sounds: Normal breath sounds. No wheezing or rhonchi.  Abdominal:     Palpations: Abdomen is soft.     Tenderness: There is no abdominal tenderness.  Musculoskeletal:        General: Normal range of motion.     Cervical back: Neck supple.  Skin:    General: Skin is warm and dry.     Findings: Erythema present.     Comments: She has multiple hive-like areas on her upper arm and lower leg.  Some excoriations from scratching.  Neurological:     General: No focal deficit present.     Mental Status: She is alert.     ED Results / Procedures / Treatments   Labs (all labs  ordered are listed, but only abnormal results are displayed) Labs Reviewed - No data to display  EKG None  Radiology No results found.  Procedures Procedures   Medications Ordered in ED Medications - No data to display  ED Course  I have reviewed the triage vital signs and the nursing notes.  Pertinent labs & imaging results that were available during my care of the patient were reviewed by me and considered in my medical decision making (  see chart for details).    MDM Rules/Calculators/A&P                         60 year old female with COVID positive infection here with 2 days of urticarial rash.  Unclear if this reflects her COVID infection or is associated with any of the medication she might of been taking for her symptomatic relief.  She has no airway compromise and her lungs are clear sats are good.  Will cover with some steroids and Atarax for itching.  Return instructions discussed  Final Clinical Impression(s) / ED Diagnoses Final diagnoses:  Urticaria  COVID-19 virus infection    Rx / DC Orders ED Discharge Orders         Ordered    hydrOXYzine (ATARAX/VISTARIL) 25 MG tablet  Every 8 hours PRN       Note to Pharmacy: change from 50mg  to 25mg , tabs are not scored   02/02/21 1150    predniSONE (DELTASONE) 50 MG tablet  Daily        02/02/21 1150           Hayden Rasmussen, MD 02/02/21 1639

## 2021-02-11 DIAGNOSIS — J40 Bronchitis, not specified as acute or chronic: Secondary | ICD-10-CM | POA: Diagnosis not present

## 2021-02-18 DIAGNOSIS — R059 Cough, unspecified: Secondary | ICD-10-CM | POA: Diagnosis not present

## 2021-04-26 DIAGNOSIS — F411 Generalized anxiety disorder: Secondary | ICD-10-CM | POA: Diagnosis not present

## 2021-04-29 ENCOUNTER — Other Ambulatory Visit: Payer: Self-pay

## 2021-05-03 DIAGNOSIS — H2513 Age-related nuclear cataract, bilateral: Secondary | ICD-10-CM | POA: Diagnosis not present

## 2021-05-03 DIAGNOSIS — H5213 Myopia, bilateral: Secondary | ICD-10-CM | POA: Diagnosis not present

## 2021-06-07 DIAGNOSIS — Z20822 Contact with and (suspected) exposure to covid-19: Secondary | ICD-10-CM | POA: Diagnosis not present

## 2021-07-27 DIAGNOSIS — J4520 Mild intermittent asthma, uncomplicated: Secondary | ICD-10-CM | POA: Diagnosis not present

## 2021-07-27 DIAGNOSIS — G4700 Insomnia, unspecified: Secondary | ICD-10-CM | POA: Diagnosis not present

## 2021-07-27 DIAGNOSIS — R5383 Other fatigue: Secondary | ICD-10-CM | POA: Diagnosis not present

## 2021-07-27 DIAGNOSIS — M810 Age-related osteoporosis without current pathological fracture: Secondary | ICD-10-CM | POA: Diagnosis not present

## 2021-07-27 DIAGNOSIS — E559 Vitamin D deficiency, unspecified: Secondary | ICD-10-CM | POA: Diagnosis not present

## 2021-07-27 DIAGNOSIS — F411 Generalized anxiety disorder: Secondary | ICD-10-CM | POA: Diagnosis not present

## 2021-07-28 ENCOUNTER — Other Ambulatory Visit: Payer: Self-pay | Admitting: Family Medicine

## 2021-07-28 DIAGNOSIS — E049 Nontoxic goiter, unspecified: Secondary | ICD-10-CM

## 2021-07-28 DIAGNOSIS — Z6827 Body mass index (BMI) 27.0-27.9, adult: Secondary | ICD-10-CM | POA: Diagnosis not present

## 2021-07-28 DIAGNOSIS — Z1231 Encounter for screening mammogram for malignant neoplasm of breast: Secondary | ICD-10-CM | POA: Diagnosis not present

## 2021-07-28 DIAGNOSIS — Z01419 Encounter for gynecological examination (general) (routine) without abnormal findings: Secondary | ICD-10-CM | POA: Diagnosis not present

## 2021-08-09 ENCOUNTER — Ambulatory Visit
Admission: RE | Admit: 2021-08-09 | Discharge: 2021-08-09 | Disposition: A | Discharge status: Home / Self Care | Payer: BC Managed Care – PPO | Source: Ambulatory Visit | Attending: Family Medicine | Admitting: Family Medicine

## 2021-08-09 DIAGNOSIS — E041 Nontoxic single thyroid nodule: Secondary | ICD-10-CM | POA: Diagnosis not present

## 2021-08-09 DIAGNOSIS — E049 Nontoxic goiter, unspecified: Secondary | ICD-10-CM

## 2021-08-16 ENCOUNTER — Other Ambulatory Visit: Payer: Self-pay | Admitting: Family Medicine

## 2021-08-16 DIAGNOSIS — E041 Nontoxic single thyroid nodule: Secondary | ICD-10-CM

## 2021-08-23 ENCOUNTER — Ambulatory Visit
Admission: RE | Admit: 2021-08-23 | Discharge: 2021-08-23 | Disposition: A | Discharge status: Home / Self Care | Payer: BC Managed Care – PPO | Source: Ambulatory Visit | Attending: Family Medicine | Admitting: Family Medicine

## 2021-08-23 ENCOUNTER — Other Ambulatory Visit (HOSPITAL_COMMUNITY)
Admission: RE | Admit: 2021-08-23 | Discharge: 2021-08-23 | Disposition: A | Discharge status: Home / Self Care | Payer: BC Managed Care – PPO | Source: Ambulatory Visit | Attending: Interventional Radiology | Admitting: Interventional Radiology

## 2021-08-23 DIAGNOSIS — E041 Nontoxic single thyroid nodule: Secondary | ICD-10-CM | POA: Diagnosis not present

## 2021-08-23 DIAGNOSIS — D34 Benign neoplasm of thyroid gland: Secondary | ICD-10-CM | POA: Diagnosis not present

## 2021-09-07 LAB — CYTOLOGY - NON PAP

## 2021-09-16 DIAGNOSIS — E041 Nontoxic single thyroid nodule: Secondary | ICD-10-CM | POA: Diagnosis not present

## 2021-09-16 DIAGNOSIS — F320 Major depressive disorder, single episode, mild: Secondary | ICD-10-CM | POA: Diagnosis not present

## 2021-09-16 DIAGNOSIS — F411 Generalized anxiety disorder: Secondary | ICD-10-CM | POA: Diagnosis not present

## 2021-09-16 DIAGNOSIS — G4701 Insomnia due to medical condition: Secondary | ICD-10-CM | POA: Diagnosis not present

## 2021-10-05 DIAGNOSIS — N958 Other specified menopausal and perimenopausal disorders: Secondary | ICD-10-CM | POA: Diagnosis not present

## 2021-10-05 DIAGNOSIS — M8588 Other specified disorders of bone density and structure, other site: Secondary | ICD-10-CM | POA: Diagnosis not present

## 2021-10-05 DIAGNOSIS — M818 Other osteoporosis without current pathological fracture: Secondary | ICD-10-CM | POA: Diagnosis not present

## 2021-10-05 DIAGNOSIS — Z13820 Encounter for screening for osteoporosis: Secondary | ICD-10-CM | POA: Diagnosis not present

## 2021-10-05 DIAGNOSIS — Z7983 Long term (current) use of bisphosphonates: Secondary | ICD-10-CM | POA: Diagnosis not present

## 2021-11-11 DIAGNOSIS — Z136 Encounter for screening for cardiovascular disorders: Secondary | ICD-10-CM | POA: Diagnosis not present

## 2021-11-11 DIAGNOSIS — E041 Nontoxic single thyroid nodule: Secondary | ICD-10-CM | POA: Diagnosis not present

## 2021-11-11 DIAGNOSIS — E559 Vitamin D deficiency, unspecified: Secondary | ICD-10-CM | POA: Diagnosis not present

## 2021-11-11 DIAGNOSIS — Z0000 Encounter for general adult medical examination without abnormal findings: Secondary | ICD-10-CM | POA: Diagnosis not present

## 2021-11-18 DIAGNOSIS — Z01419 Encounter for gynecological examination (general) (routine) without abnormal findings: Secondary | ICD-10-CM | POA: Diagnosis not present

## 2021-11-18 DIAGNOSIS — J45991 Cough variant asthma: Secondary | ICD-10-CM | POA: Diagnosis not present

## 2021-11-18 DIAGNOSIS — Z1231 Encounter for screening mammogram for malignant neoplasm of breast: Secondary | ICD-10-CM | POA: Diagnosis not present

## 2021-11-18 DIAGNOSIS — E785 Hyperlipidemia, unspecified: Secondary | ICD-10-CM | POA: Diagnosis not present

## 2021-11-18 DIAGNOSIS — Z136 Encounter for screening for cardiovascular disorders: Secondary | ICD-10-CM | POA: Diagnosis not present

## 2021-11-18 DIAGNOSIS — Z0001 Encounter for general adult medical examination with abnormal findings: Secondary | ICD-10-CM | POA: Diagnosis not present

## 2021-11-18 DIAGNOSIS — G4701 Insomnia due to medical condition: Secondary | ICD-10-CM | POA: Diagnosis not present

## 2021-11-18 DIAGNOSIS — R0600 Dyspnea, unspecified: Secondary | ICD-10-CM | POA: Diagnosis not present

## 2021-12-01 DIAGNOSIS — K760 Fatty (change of) liver, not elsewhere classified: Secondary | ICD-10-CM | POA: Diagnosis not present

## 2021-12-01 DIAGNOSIS — R1310 Dysphagia, unspecified: Secondary | ICD-10-CM | POA: Diagnosis not present

## 2021-12-01 DIAGNOSIS — K5904 Chronic idiopathic constipation: Secondary | ICD-10-CM | POA: Diagnosis not present

## 2021-12-01 DIAGNOSIS — K219 Gastro-esophageal reflux disease without esophagitis: Secondary | ICD-10-CM | POA: Diagnosis not present

## 2021-12-30 ENCOUNTER — Other Ambulatory Visit: Payer: Self-pay | Admitting: Internal Medicine

## 2021-12-30 DIAGNOSIS — R0602 Shortness of breath: Secondary | ICD-10-CM

## 2021-12-30 NOTE — Progress Notes (Signed)
? ?Office Visit Note ? ?Patient: Veronica Booth             ?Date of Birth: 1961-01-21           ?MRN: [id number]             ?PCP: Audley Hose, MD ?Referring: Darra Lis* ?Visit Date: 01/03/2022 ?Occupation: '@GUAROCC'$ @ ? ?Subjective:  ?Management of osteoporosis ? ?History of Present Illness: Veronica Booth is a 61 y.o. female seen in consultation per request of Dr. Collene Mares for the evaluation of osteoporosis.  According the patient about 6 years ago she was diagnosed with osteoporosis by her GYN.  She had a repeat DEXA scan 2 years later after taking calcium and vitamin D for 2 years.  According to the patient DEXA scan showed deterioration in the bone density. She was placed on alendronate about 4 years ago.  She states she has been taking alendronate 70 mg p.o. weekly almost every other week over the last 4 years.  She states it causes reflux and also difficulty swallowing.  She went to see Dr. Collene Mares due to difficulty swallowing and reflux symptoms.  She has been off alendronate for about 2 months now.  She takes calcium and vitamin D on a regular basis.  She gives history of degenerative disc disease involving cervical thoracic and lumbar spine.  She goes to [employer] [facility].  She has ongoing pain in the cervical and lumbar region.  She states physical therapy helps.  She has had intermittent discomfort in her left knee joint which resolved.  She states she has taken prednisone for several months in the past due to neck pain.  There is family history of osteoporosis in her maternal grandmother. ? ?Activities of Daily Living:  ?Patient reports morning stiffness for 30 minutes.   ?Patient Denies nocturnal pain.  ?Difficulty dressing/grooming: Denies ?Difficulty climbing stairs: Denies ?Difficulty getting out of chair: Denies ?Difficulty using hands for taps, buttons, cutlery, and/or writing: Reports ? ?Review of Systems  ?Constitutional:  Negative for fatigue.  ?HENT:  Negative for mouth  sores, mouth dryness and nose dryness.   ?Eyes:  Negative for pain, itching and dryness.  ?Respiratory:  Positive for shortness of breath. Negative for difficulty breathing.   ?Cardiovascular:  Negative for chest pain and palpitations.  ?Gastrointestinal:  Positive for heartburn. Negative for blood in stool, constipation and diarrhea.  ?Endocrine: Negative for increased urination.  ?Genitourinary:  Negative for difficulty urinating.  ?Musculoskeletal:  Positive for myalgias, morning stiffness, muscle tenderness and myalgias. Negative for joint pain, joint pain and joint swelling.  ?Skin:  Negative for color change, rash, redness and sensitivity to sunlight.  ?Allergic/Immunologic: Negative for susceptible to infections.  ?Neurological:  Negative for dizziness, numbness, headaches, memory loss and weakness.  ?Hematological:  Positive for bruising/bleeding tendency. Negative for swollen glands.  ?Psychiatric/Behavioral:  Positive for depressed mood and sleep disturbance. Negative for confusion. The patient is nervous/anxious.   ? ?PMFS History:  ?Patient Active Problem List  ? Diagnosis Date Noted  ? SOB (shortness of breath) 04/29/2019  ? Chest pain of uncertain etiology 19/62/2297  ? Nonallopathic lesion of cervical region 08/04/2016  ? Nonallopathic lesion of thoracic region 08/04/2016  ? Nonallopathic lesion of sacral region 08/04/2016  ? Nonallopathic lesion of lumbosacral region 08/04/2016  ? Trigger point of left shoulder region 07/14/2016  ? Poor posture 07/14/2016  ? Cervical disc disorder with radiculopathy of cervical region 07/14/2016  ? Cervical pain (neck) 03/31/2016  ? Right  knee pain 03/31/2016  ?  ?Past Medical History:  ?Diagnosis Date  ? Body mass index (bmi) 25.0-25.9, adult   ? Chest pain   ? Cough   ? Deep dyspareunia   ? DJD (degenerative joint disease) of cervical spine   ? Eczema   ? Elevated blood pressure reading without diagnosis of hypertension   ? Heart palpitations   ? History of tobacco  abuse   ? Insomnia   ? Light headedness   ? Low vitamin D level   ? Right hand paresthesia   ? Urinary frequency   ? Urticaria   ?  ?Family History  ?Problem Relation Age of Onset  ? Other Mother   ?     ACCIDENTALLY  ? High Cholesterol Father   ? Healthy Sister   ? Healthy Sister   ? Healthy Sister   ? Healthy Sister   ? Cancer Maternal Aunt   ?     BREAST CANCER  ? Canavan disease Paternal Aunt   ? Cancer Paternal Aunt   ?     BREAST CANCER  ? Hodgkin's lymphoma Child   ? ?Past Surgical History:  ?Procedure Laterality Date  ? CESAREAN SECTION    ? TONSILLECTOMY    ? 61 years of age  ? ?Social History  ? ?Social History Narrative  ? Not on file  ? ? ?There is no immunization history on file for this patient.  ? ?Objective: ?Vital Signs: BP 119/79 (BP Location: Right Arm, Patient Position: Sitting, Cuff Size: Normal)   Pulse 60   Ht 5' 0.5" (1.537 m)   Wt 148 lb 6.4 oz (67.3 kg)   LMP 08/02/2016   BMI 28.51 kg/m?   ? ?Physical Exam ?Vitals and nursing note reviewed.  ?Constitutional:   ?   Appearance: She is well-developed.  ?HENT:  ?   Head: Normocephalic and atraumatic.  ?Eyes:  ?   Conjunctiva/sclera: Conjunctivae normal.  ?Cardiovascular:  ?   Rate and Rhythm: Normal rate and regular rhythm.  ?   Heart sounds: Normal heart sounds.  ?Pulmonary:  ?   Effort: Pulmonary effort is normal.  ?   Breath sounds: Normal breath sounds.  ?Abdominal:  ?   General: Bowel sounds are normal.  ?   Palpations: Abdomen is soft.  ?Musculoskeletal:  ?   Cervical back: Normal range of motion.  ?Lymphadenopathy:  ?   Cervical: No cervical adenopathy.  ?Skin: ?   General: Skin is warm and dry.  ?   Capillary Refill: Capillary refill takes less than 2 seconds.  ?Neurological:  ?   Mental Status: She is alert and oriented to person, place, and time.  ?Psychiatric:     ?   Behavior: Behavior normal.  ?  ? ?Musculoskeletal Exam: C-spine was full range of motion.  She had discomfort range of motion of her lumbar spine.  She had good  mobility in her lumbar spine.  Shoulder joints, elbow joints, wrist joints with good range of motion.  She had bilateral PIP and DIP thickening with no synovitis.  Hip joints and knee joints with good range of motion.  She had no tenderness over ankles or MTPs. ? ?CDAI Exam: ?CDAI Score: -- ?Patient Global: --; Provider Global: -- ?Swollen: --; Tender: -- ?Joint Exam 01/03/2022  ? ?No joint exam has been documented for this visit  ? ?There is currently no information documented on the homunculus. Go to the Rheumatology activity and complete the homunculus joint exam. ? ?  Investigation: ?No additional findings. ? ?Imaging: ?No results found. ? ?Recent Labs: ?Lab Results  ?Component Value Date  ? WBC 5.4 10/09/2019  ? HGB 13.5 10/09/2019  ? PLT 233 10/09/2019  ? NA 141 10/09/2019  ? K 4.6 10/09/2019  ? CL 104 10/09/2019  ? CO2 24 10/09/2019  ? GLUCOSE 86 10/09/2019  ? BUN 15 10/09/2019  ? CREATININE 0.72 10/09/2019  ? BILITOT 0.3 10/09/2019  ? ALKPHOS 177 (H) 10/09/2019  ? AST 31 10/09/2019  ? ALT 54 (H) 10/09/2019  ? PROT 7.2 10/09/2019  ? ALBUMIN 4.5 10/09/2019  ? CALCIUM 9.5 10/09/2019  ? GFRAA 106 10/09/2019  ? ? ?Speciality Comments: No specialty comments available. ? ?Procedures:  ?No procedures performed ?Allergies: Patient has no known allergies.  ? ?Assessment / Plan:     ?Visit Diagnoses: Other osteoporosis without current pathological fracture -patient states that she was diagnosed with osteoporosis several years ago.  She has been taking alendronate almost every other week for the last 4 years.  Her last DEXA scan showed improvement in the T score per patient.  I do not have any of those results available.  We will request DEXA results from her GYN's office.  If she is taking alendronate for 4 years then she should be off alendronate.  It will be important to maintain good vitamin D level.  Patient states she has had vitamin D deficiency in the past.  I advised her to take vitamin D 2000 units daily.  She  has taken high-dose steroids for almost a year per patient in the past.  She denies use of Synthroid or and epileptic agents in the past.  She has not been exercising.  Dietary modifications and exercises wer

## 2022-01-02 ENCOUNTER — Ambulatory Visit (INDEPENDENT_AMBULATORY_CARE_PROVIDER_SITE_OTHER): Payer: BC Managed Care – PPO | Admitting: Internal Medicine

## 2022-01-02 DIAGNOSIS — R0602 Shortness of breath: Secondary | ICD-10-CM | POA: Diagnosis not present

## 2022-01-02 NOTE — Progress Notes (Signed)
PFT done today. 

## 2022-01-03 ENCOUNTER — Encounter: Payer: Self-pay | Admitting: Rheumatology

## 2022-01-03 ENCOUNTER — Ambulatory Visit: Payer: BC Managed Care – PPO | Admitting: Rheumatology

## 2022-01-03 VITALS — BP 119/79 | HR 60 | Ht 60.5 in | Wt 148.4 lb

## 2022-01-03 DIAGNOSIS — Z79899 Other long term (current) drug therapy: Secondary | ICD-10-CM | POA: Diagnosis not present

## 2022-01-03 DIAGNOSIS — G4709 Other insomnia: Secondary | ICD-10-CM

## 2022-01-03 DIAGNOSIS — M818 Other osteoporosis without current pathological fracture: Secondary | ICD-10-CM | POA: Diagnosis not present

## 2022-01-03 DIAGNOSIS — G8929 Other chronic pain: Secondary | ICD-10-CM

## 2022-01-03 DIAGNOSIS — I10 Essential (primary) hypertension: Secondary | ICD-10-CM

## 2022-01-03 DIAGNOSIS — E041 Nontoxic single thyroid nodule: Secondary | ICD-10-CM

## 2022-01-03 DIAGNOSIS — Z8659 Personal history of other mental and behavioral disorders: Secondary | ICD-10-CM

## 2022-01-03 DIAGNOSIS — M999 Biomechanical lesion, unspecified: Secondary | ICD-10-CM

## 2022-01-03 DIAGNOSIS — Z87442 Personal history of urinary calculi: Secondary | ICD-10-CM

## 2022-01-03 DIAGNOSIS — K760 Fatty (change of) liver, not elsewhere classified: Secondary | ICD-10-CM

## 2022-01-03 DIAGNOSIS — M5010 Cervical disc disorder with radiculopathy, unspecified cervical region: Secondary | ICD-10-CM

## 2022-01-03 DIAGNOSIS — E559 Vitamin D deficiency, unspecified: Secondary | ICD-10-CM

## 2022-01-03 DIAGNOSIS — M19041 Primary osteoarthritis, right hand: Secondary | ICD-10-CM

## 2022-01-03 DIAGNOSIS — M25512 Pain in left shoulder: Secondary | ICD-10-CM

## 2022-01-03 DIAGNOSIS — R0602 Shortness of breath: Secondary | ICD-10-CM

## 2022-01-03 DIAGNOSIS — M19042 Primary osteoarthritis, left hand: Secondary | ICD-10-CM

## 2022-01-03 DIAGNOSIS — Z8719 Personal history of other diseases of the digestive system: Secondary | ICD-10-CM

## 2022-01-03 LAB — PULMONARY FUNCTION TEST
DL/VA % pred: 97 %
DL/VA: 4.2 ml/min/mmHg/L
DLCO cor % pred: 95 %
DLCO cor: 17.27 ml/min/mmHg
DLCO unc % pred: 95 %
DLCO unc: 17.27 ml/min/mmHg
FEF 25-75 Post: 3.49 L/sec
FEF 25-75 Pre: 3.64 L/sec
FEF2575-%Change-Post: -4 %
FEF2575-%Pred-Post: 163 %
FEF2575-%Pred-Pre: 170 %
FEV1-%Change-Post: -1 %
FEV1-%Pred-Post: 109 %
FEV1-%Pred-Pre: 111 %
FEV1-Post: 2.47 L
FEV1-Pre: 2.5 L
FEV1FVC-%Change-Post: 0 %
FEV1FVC-%Pred-Pre: 112 %
FEV6-%Change-Post: -1 %
FEV6-%Pred-Post: 100 %
FEV6-%Pred-Pre: 102 %
FEV6-Post: 2.82 L
FEV6-Pre: 2.87 L
FEV6FVC-%Pred-Post: 103 %
FEV6FVC-%Pred-Pre: 103 %
FVC-%Change-Post: -1 %
FVC-%Pred-Post: 96 %
FVC-%Pred-Pre: 98 %
FVC-Post: 2.82 L
FVC-Pre: 2.87 L
Post FEV1/FVC ratio: 88 %
Post FEV6/FVC ratio: 100 %
Pre FEV1/FVC ratio: 87 %
Pre FEV6/FVC Ratio: 100 %
RV % pred: 112 %
RV: 2.08 L
TLC % pred: 108 %
TLC: 4.98 L

## 2022-01-03 NOTE — Patient Instructions (Addendum)
Back Exercises ?The following exercises strengthen the muscles that help to support the trunk (torso) and back. They also help to keep the lower back flexible. Doing these exercises can help to prevent or lessen existing low back pain. ?If you have back pain or discomfort, try doing these exercises 2-3 times each day or as told by your health care provider. ?As your pain improves, do them once each day, but increase the number of times that you repeat the steps for each exercise (do more repetitions). ?To prevent the recurrence of back pain, continue to do these exercises once each day or as told by your health care provider. ?Do exercises exactly as told by your health care provider and adjust them as directed. It is normal to feel mild stretching, pulling, tightness, or discomfort as you do these exercises, but you should stop right away if you feel sudden pain or your pain gets worse. ?Exercises ?Single knee to chest ?Repeat these steps 3-5 times for each leg: ?Lie on your back on a firm bed or the floor with your legs extended. ?Bring one knee to your chest. Your other leg should stay extended and in contact with the floor. ?Hold your knee in place by grabbing your knee or thigh with both hands and hold. ?Pull on your knee until you feel a gentle stretch in your lower back or buttocks. ?Hold the stretch for 10-30 seconds. ?Slowly release and straighten your leg. ? ?Pelvic tilt ?Repeat these steps 5-10 times: ?Lie on your back on a firm bed or the floor with your legs extended. ?Bend your knees so they are pointing toward the ceiling and your feet are flat on the floor. ?Tighten your lower abdominal muscles to press your lower back against the floor. This motion will tilt your pelvis so your tailbone points up toward the ceiling instead of pointing to your feet or the floor. ?With gentle tension and even breathing, hold this position for 5-10 seconds. ? ?Cat-cow ?Repeat these steps until your lower back becomes  more flexible: ?Get into a hands-and-knees position on a firm bed or the floor. Keep your hands under your shoulders, and keep your knees under your hips. You may place padding under your knees for comfort. ?Let your head hang down toward your chest. Contract your abdominal muscles and point your tailbone toward the floor so your lower back becomes rounded like the back of a cat. ?Hold this position for 5 seconds. ?Slowly lift your head, let your abdominal muscles relax, and point your tailbone up toward the ceiling so your back forms a sagging arch like the back of a cow. ?Hold this position for 5 seconds. ? ?Press-ups ?Repeat these steps 5-10 times: ?Lie on your abdomen (face-down) on a firm bed or the floor. ?Place your palms near your head, about shoulder-width apart. ?Keeping your back as relaxed as possible and keeping your hips on the floor, slowly straighten your arms to raise the top half of your body and lift your shoulders. Do not use your back muscles to raise your upper torso. You may adjust the placement of your hands to make yourself more comfortable. ?Hold this position for 5 seconds while you keep your back relaxed. ?Slowly return to lying flat on the floor. ? ?Bridges ?Repeat these steps 10 times: ?Lie on your back on a firm bed or the floor. ?Bend your knees so they are pointing toward the ceiling and your feet are flat on the floor. Your arms should be flat  at your sides, next to your body. ?Tighten your buttocks muscles and lift your buttocks off the floor until your waist is at almost the same height as your knees. You should feel the muscles working in your buttocks and the back of your thighs. If you do not feel these muscles, slide your feet 1-2 inches (2.5-5 cm) farther away from your buttocks. ?Hold this position for 3-5 seconds. ?Slowly lower your hips to the starting position, and allow your buttocks muscles to relax completely. ?If this exercise is too easy, try doing it with your arms  crossed over your chest. ?Abdominal crunches ?Repeat these steps 5-10 times: ?Lie on your back on a firm bed or the floor with your legs extended. ?Bend your knees so they are pointing toward the ceiling and your feet are flat on the floor. ?Cross your arms over your chest. ?Tip your chin slightly toward your chest without bending your neck. ?Tighten your abdominal muscles and slowly raise your torso high enough to lift your shoulder blades a tiny bit off the floor. Avoid raising your torso higher than that because it can put too much stress on your lower back and does not help to strengthen your abdominal muscles. ?Slowly return to your starting position. ? ?Back lifts ?Repeat these steps 5-10 times: ?Lie on your abdomen (face-down) with your arms at your sides, and rest your forehead on the floor. ?Tighten the muscles in your legs and your buttocks. ?Slowly lift your chest off the floor while you keep your hips pressed to the floor. Keep the back of your head in line with the curve in your back. Your eyes should be looking at the floor. ?Hold this position for 3-5 seconds. ?Slowly return to your starting position. ? ?Contact a health care provider if: ?Your back pain or discomfort gets much worse when you do an exercise. ?Your worsening back pain or discomfort does not lessen within 2 hours after you exercise. ?If you have any of these problems, stop doing these exercises right away. Do not do them again unless your health care provider says that you can. ?Get help right away if: ?You develop sudden, severe back pain. If this happens, stop doing the exercises right away. Do not do them again unless your health care provider says that you can. ?This information is not intended to replace advice given to you by your health care provider. Make sure you discuss any questions you have with your health care provider. ?Document Revised: 03/15/2021 Document Reviewed: 12/01/2020 ?Elsevier Patient Education ? [employer]. ?Cervical Strain and Sprain Rehab ?Ask your health care provider which exercises are safe for you. Do exercises exactly as told by your health care provider and adjust them as directed. It is normal to feel mild stretching, pulling, tightness, or discomfort as you do these exercises. Stop right away if you feel sudden pain or your pain gets worse. Do not begin these exercises until told by your health care provider. ?Stretching and range-of-motion exercises ?Cervical side bending ? ?Using good posture, sit on a stable chair or stand up. ?Without moving your shoulders, slowly tilt your left / right ear to your shoulder until you feel a stretch in the opposite side neck muscles. You should be looking straight ahead. ?Hold for __________ seconds. ?Repeat with the other side of your neck. ?Repeat __________ times. Complete this exercise __________ times a day. ?Cervical rotation ? ?Using good posture, sit on a stable chair or stand up. ?Slowly turn your  head to the side as if you are looking over your left / right shoulder. ?Keep your eyes level with the ground. ?Stop when you feel a stretch along the side and the back of your neck. ?Hold for __________ seconds. ?Repeat this by turning to your other side. ?Repeat __________ times. Complete this exercise __________ times a day. ?Thoracic extension and pectoral stretch ?Roll a towel or a small blanket so it is about 4 inches (10 cm) in diameter. ?Lie down on your back on a firm surface. ?Put the towel lengthwise, under your spine in the middle of your back. It should not be under your shoulder blades. The towel should line up with your spine from your middle back to your lower back. ?Put your hands behind your head and let your elbows fall out to your sides. ?Hold for __________ seconds. ?Repeat __________ times. Complete this exercise __________ times a day. ?Strengthening exercises ?Isometric upper cervical flexion ?Lie on your back with a thin pillow behind your  head and a small rolled-up towel under your neck. ?Gently tuck your chin toward your chest and nod your head down to look toward your feet. Do not lift your head off the pillow. ?Hold for __________ sec

## 2022-01-17 IMAGING — US US THYROID
1 series · 13 of 25 positions shown · non-contrast
Comparison: None.

CLINICAL DATA: Thyromegaly

EXAM:
THYROID ULTRASOUND
TECHNIQUE: Ultrasound examination of the thyroid gland and adjacent soft
tissues was performed.

[Series 1: us thyroid · 0.06mm/px · 13 of 41 slices shown]
[im 1/41]
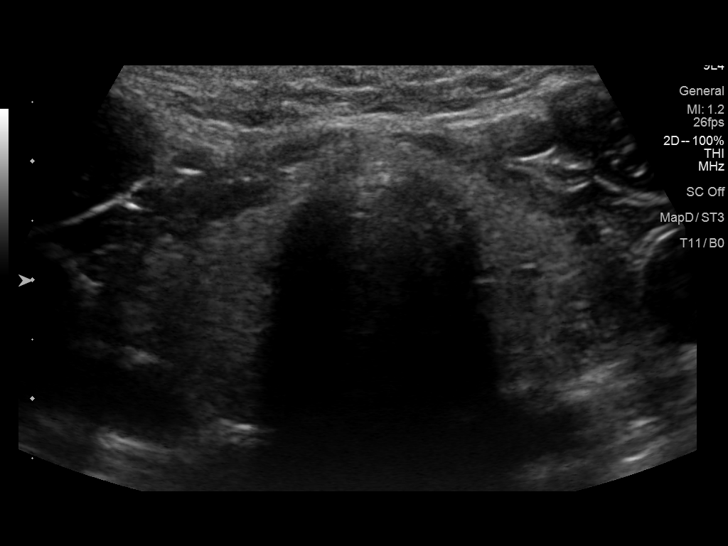
[im 4/41]
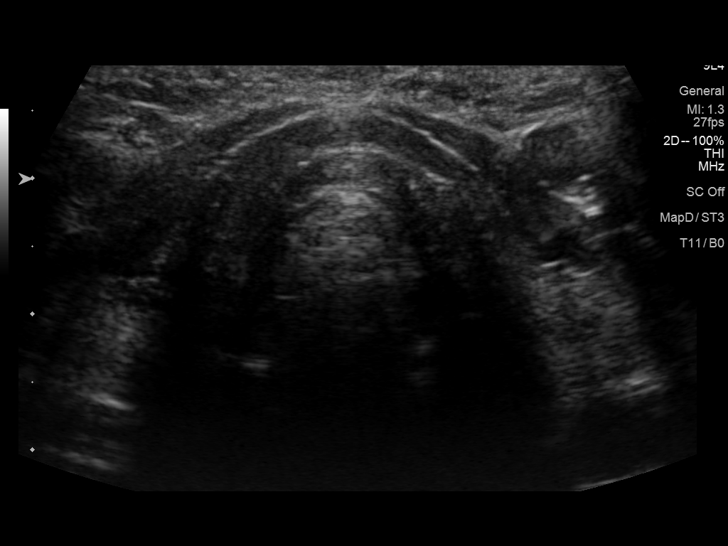
[im 7/41]
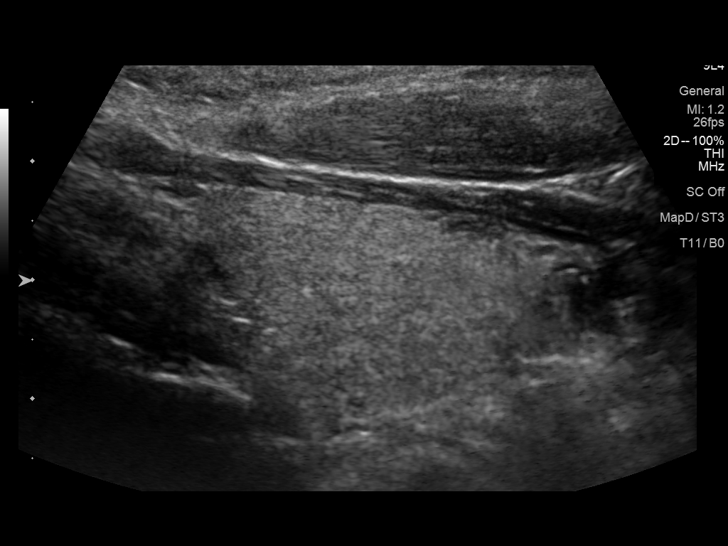
[im 11/41]
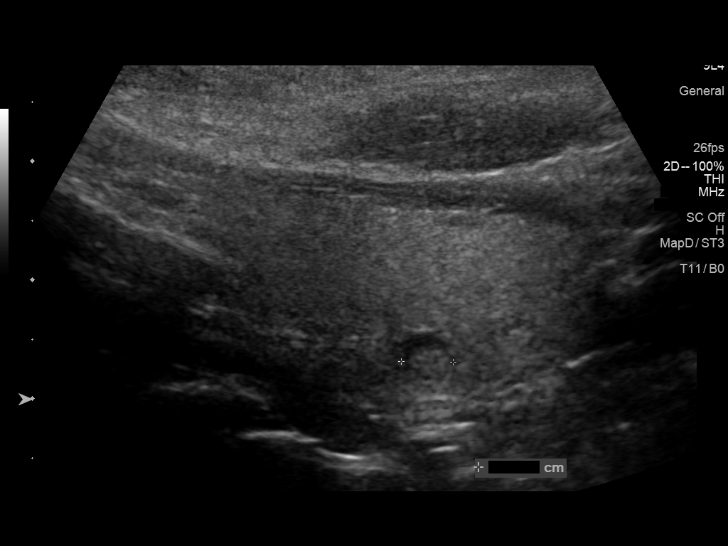
[im 14/41]
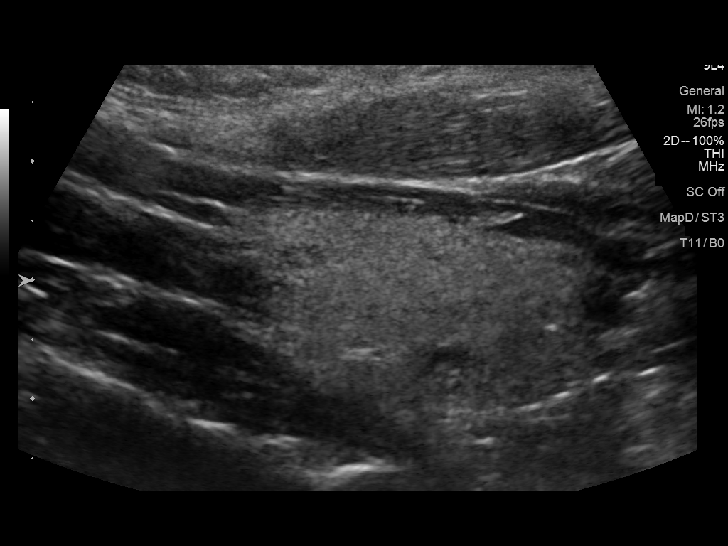
[im 17/41]
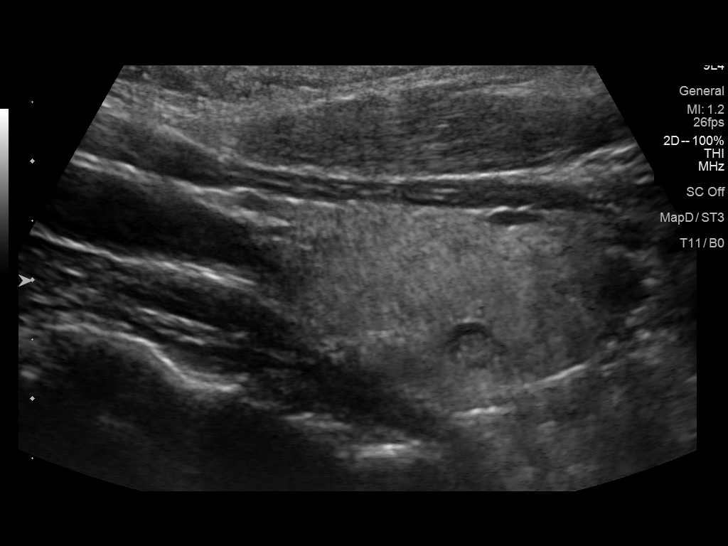
[im 21/41]
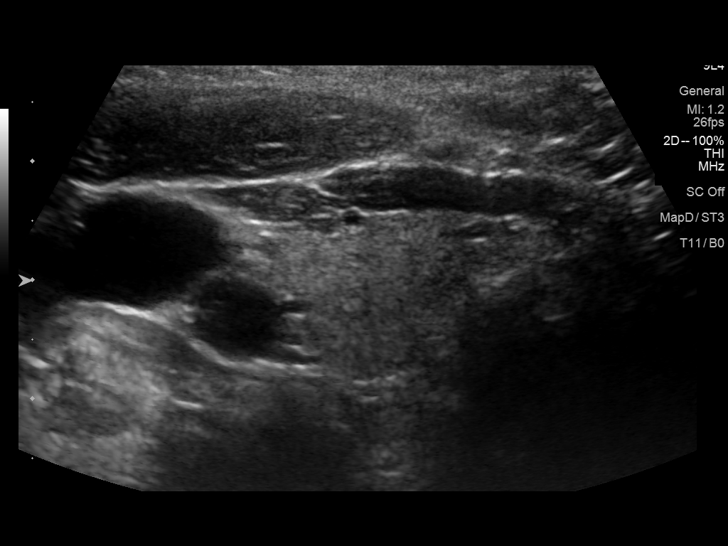
[im 24/41]
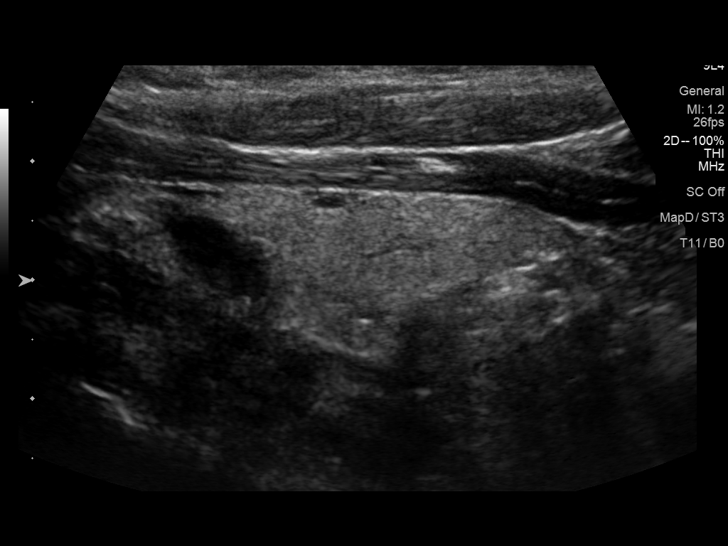
[im 27/41]
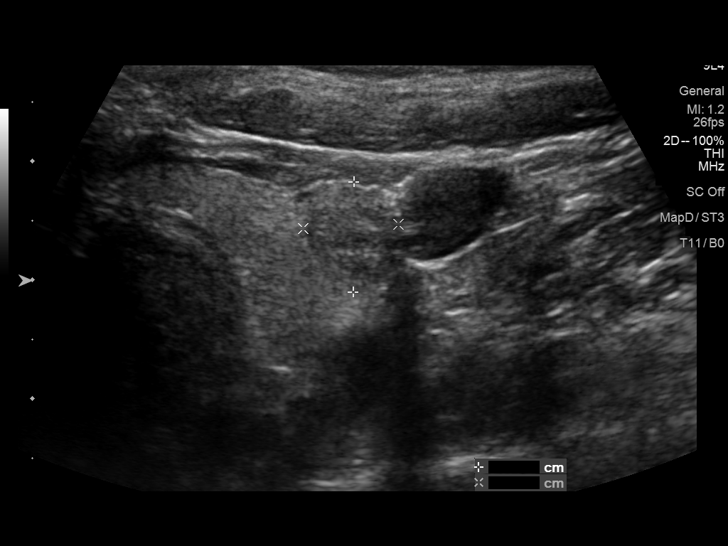
[im 31/41]
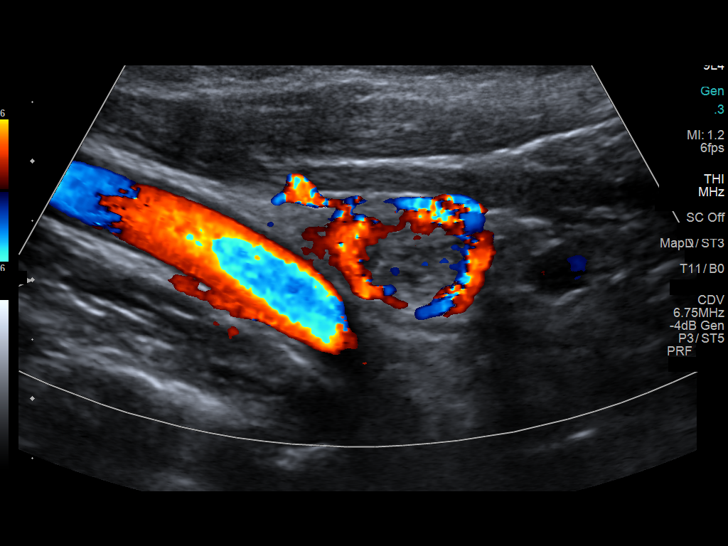
[im 34/41]
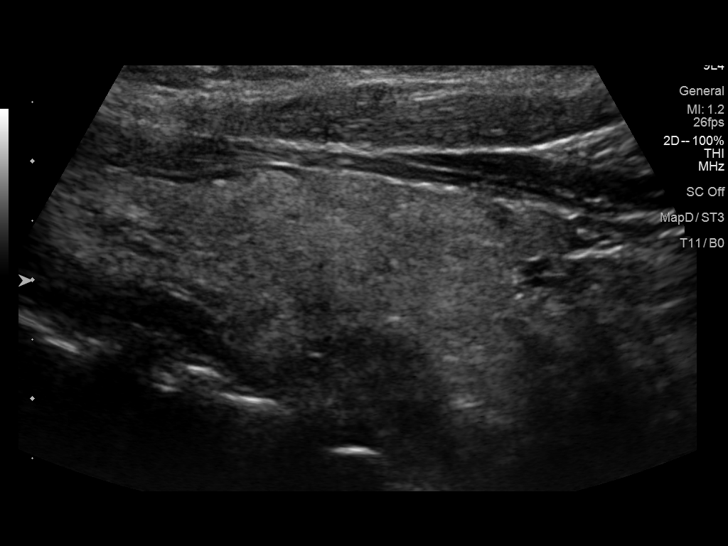
[im 37/41]
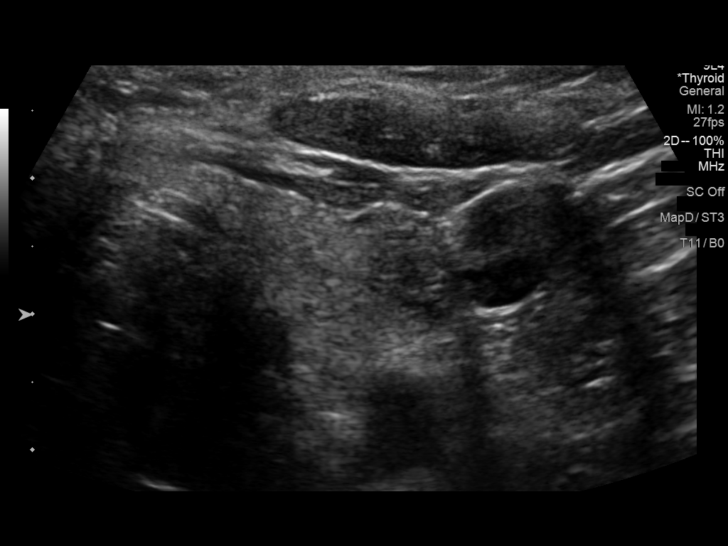
[im 41/41]
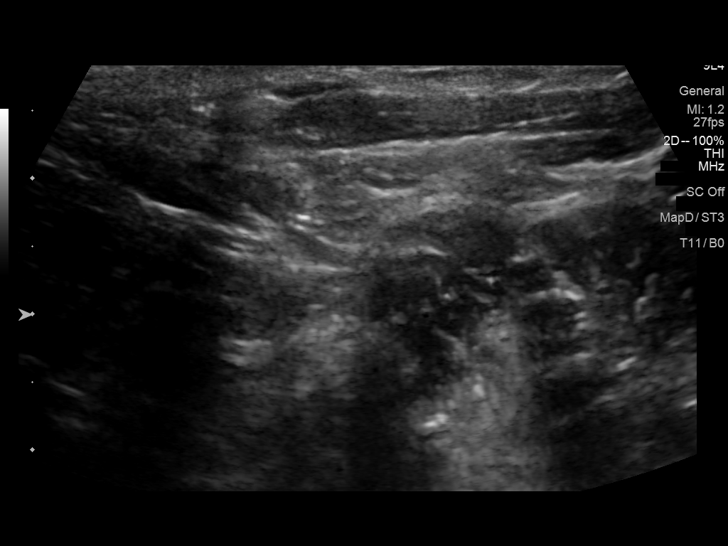

[13 of 25 positions shown; findings below may reference images not displayed]

FINDINGS: Parenchymal Echotexture: Mildly heterogeneous

Isthmus: 0.2 cm

Right lobe: 3.7 x 2.0 x 2.0 cm

Left lobe: 4.6 x 1.8 x 1.6 cm

_________________________________________________________

Estimated total number of nodules >/= 1 cm: 1

Number of spongiform nodules >/=  2 cm not described below (TR1): 0

Number of mixed cystic and solid nodules >/= 1.5 cm not described
below (TR2): 0

_________________________________________________________

Subcentimeter right thyroid nodule does not meet criteria for
imaging surveillance or FNA.

_________________________________________________________

Nodule # 1:

Location: Left; inferior

Maximum size: 1.0 cm; Other 2 dimensions: 0.9 x 0.8 cm

Composition: solid/almost completely solid (2)

Echogenicity: hypoechoic (2)

Shape: taller-than-wide (3)

Margins: smooth (0)

Echogenic foci: none (0)

ACR TI-RADS total points: 7.

ACR TI-RADS risk category: TR5 (>/= 7 points).

ACR TI-RADS recommendations:

**Given size (>/= 1.0 cm) and appearance, fine needle aspiration of
this highly suspicious nodule should be considered based on TI-RADS
criteria.

_________________________________________________________
IMPRESSION: Nodule 1 (TI-RADS 5) located in the inferior left thyroid lobe
should be further evaluated with FNA.

The above is in keeping with the ACR TI-RADS recommendations - [HOSPITAL] 0878;[DATE].

## 2022-01-26 DIAGNOSIS — H18212 Corneal edema secondary to contact lens, left eye: Secondary | ICD-10-CM | POA: Diagnosis not present

## 2022-01-31 IMAGING — US US FNA BIOPSY THYROID 1ST LESION
1 series · 13 of 14 positions shown · non-contrast
Comparison: Thyroid ultrasound August 09, 2021

MEDICATIONS:
None

COMPLICATIONS:
None immediate.

INDICATION: Indeterminate thyroid nodule

EXAM:
ULTRASOUND GUIDED FINE NEEDLE ASPIRATION OF INDETERMINATE THYROID
NODULE
TECHNIQUE: Informed written consent was obtained from the patient after a
discussion of the risks, benefits and alternatives to treatment.
Questions regarding the procedure were encouraged and answered. A
timeout was performed prior to the initiation of the procedure.

[Series 1: us fna biopsy thyroid 1st lesion · 0.06mm/px · 14 acquisitions, 13 frames shown]
[im 1/14]
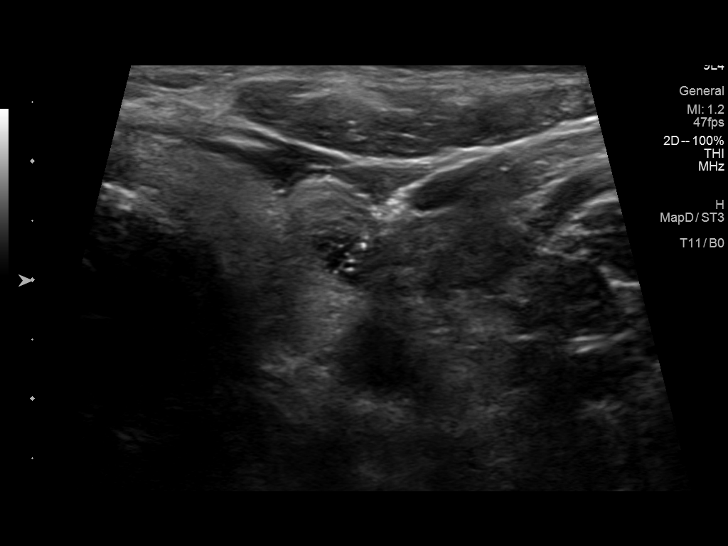
[im 2/14]
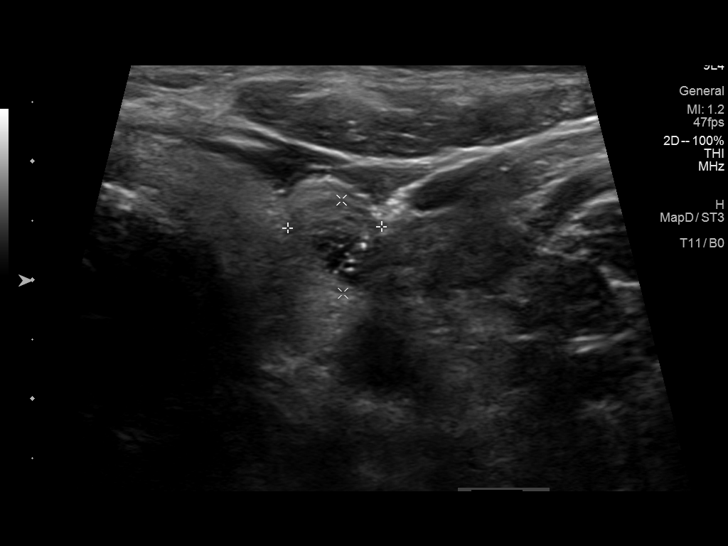
[im 3/14]
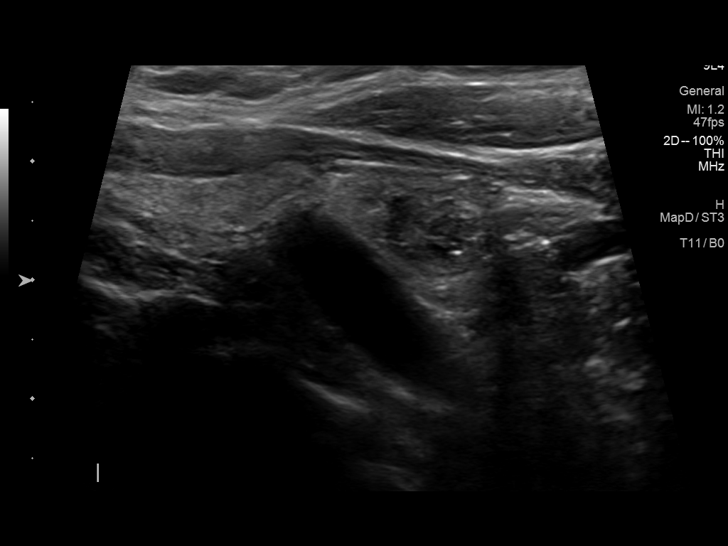
[im 4/14]
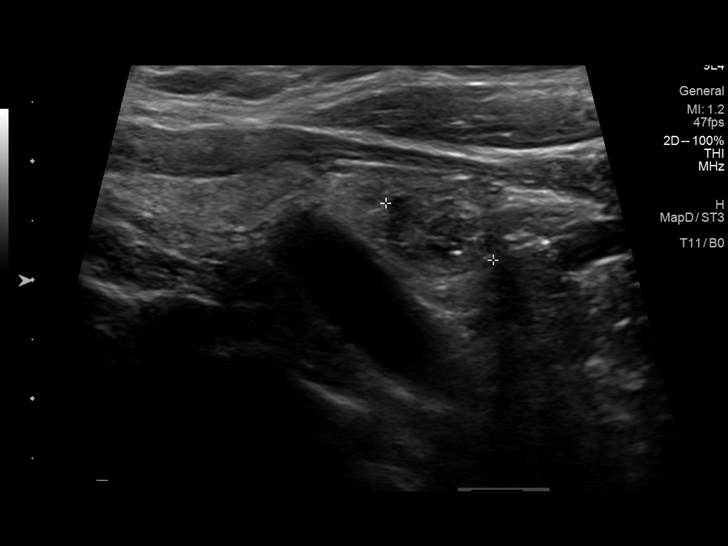
[im 5/14]
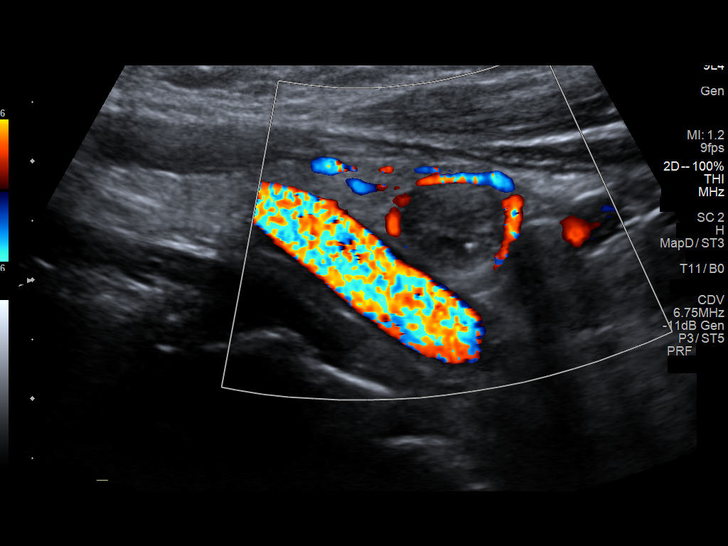
[im 6/14]
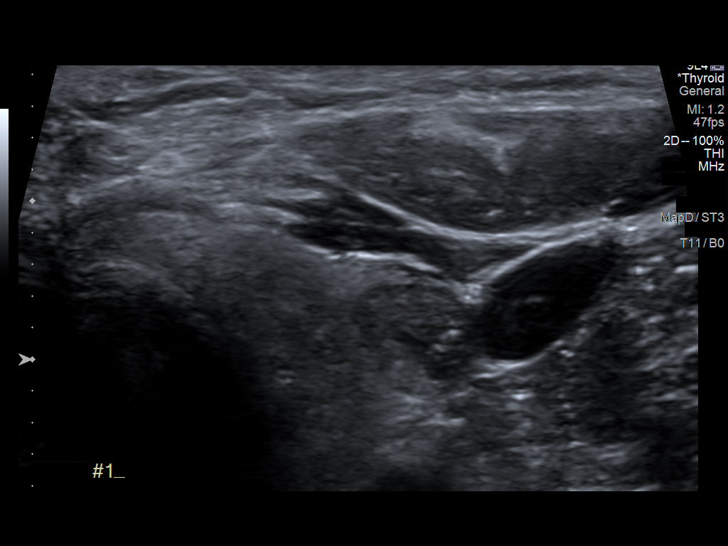
[im 8/14]
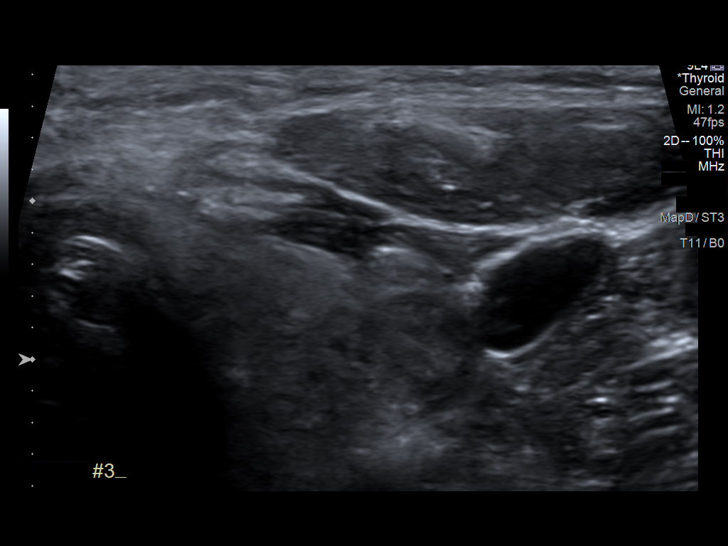
[im 9/14]
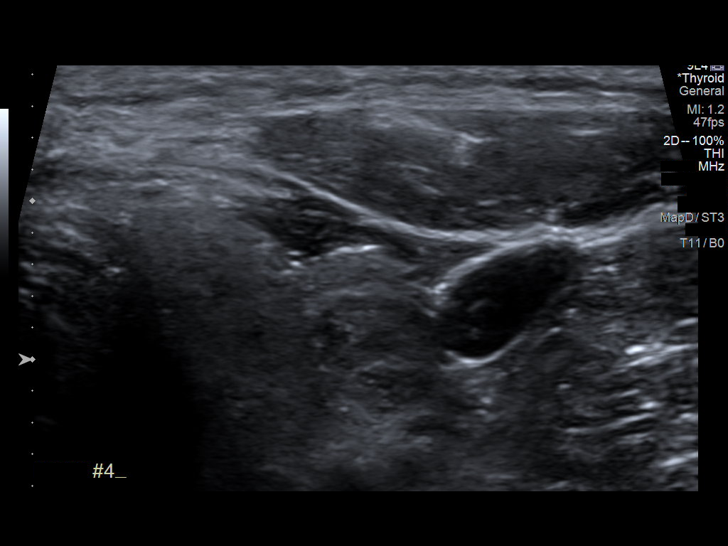
[im 10/14]
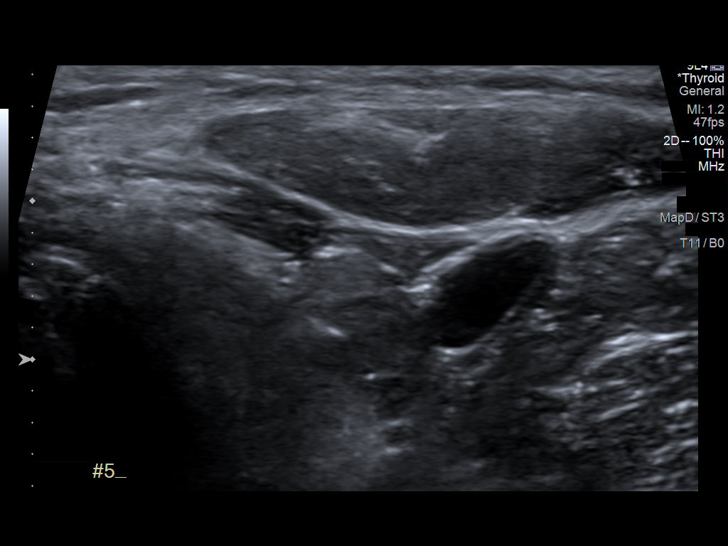
[im 11/14]
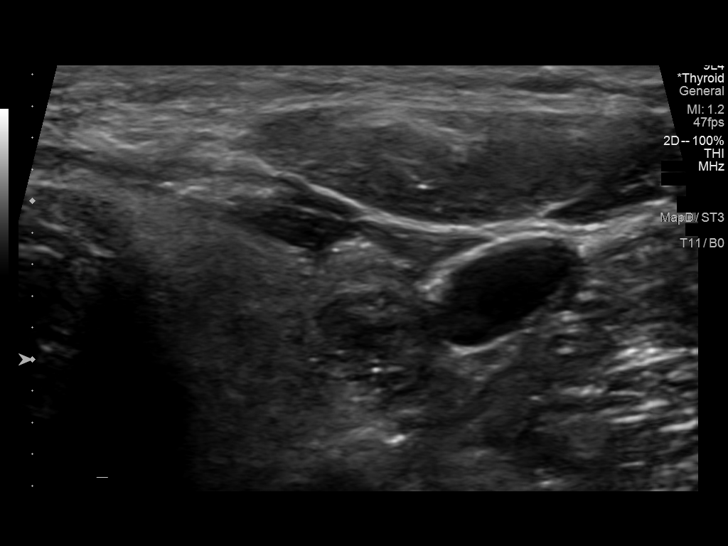
[im 12/14]
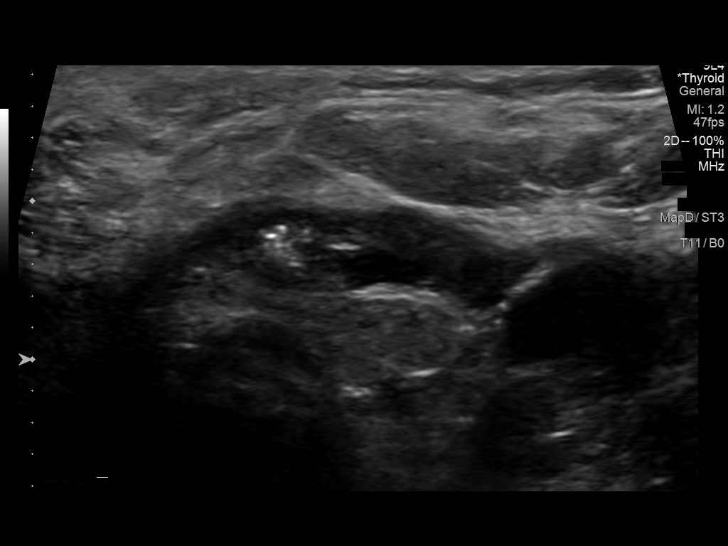
[im 13/14]
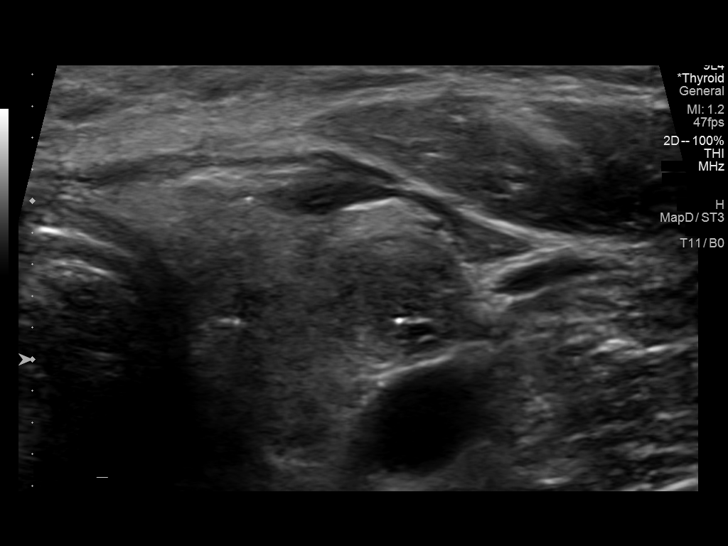
[im 14/14]
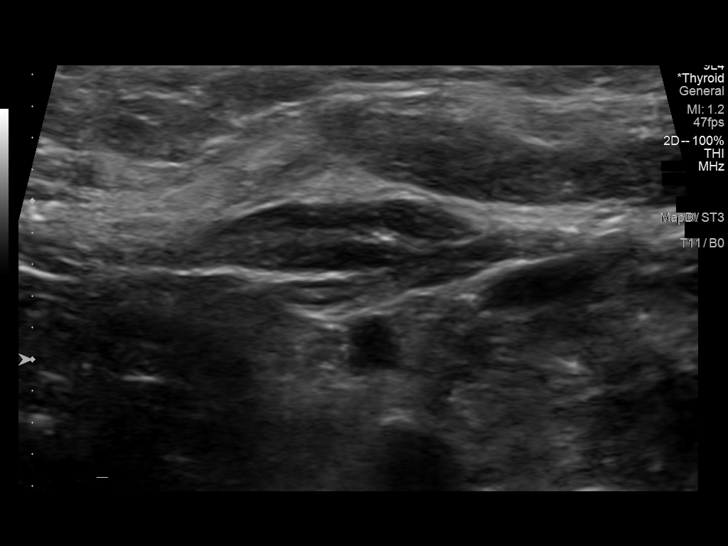

[13 of 14 positions shown; findings below may reference images not displayed]

Pre-procedural ultrasound scanning demonstrated unchanged size and
appearance of the indeterminate nodule within the inferior left
thyroid lobe measuring up to approximately 1 cm.

The procedure was planned. The neck was prepped in the usual sterile
fashion, and a sterile drape was applied covering the operative
field. A timeout was performed prior to the initiation of the
procedure. Local anesthesia was provided with 1% lidocaine.

Under direct ultrasound guidance, x5 FNA biopsies were performed of
the nodule in the inferior left thyroid lobe with a 25 gauge needle.
Multiple ultrasound images were saved for procedural documentation
purposes. The samples were prepared and submitted to pathology.

Limited post procedural scanning was negative for hematoma or
additional complication. Dressings were placed. The patient
tolerated the above procedures procedure well without immediate
postprocedural complication.
FINDINGS: Nodule reference number based on prior diagnostic ultrasound: 1

Maximum size: 1.0 cm

Location: Left; Inferior

ACR TI-RADS risk category: TR5 (>/= 7 points)

Reason for biopsy: meets ACR TI-RADS criteria

Ultrasound imaging confirms appropriate placement of the needles
within the thyroid nodule.
IMPRESSION: Technically successful ultrasound guided fine needle aspiration of
TR 5 nodule in the inferior left thyroid lobe.

## 2022-02-06 NOTE — Progress Notes (Deleted)
Office Visit Note  Patient: Veronica Booth             Date of Birth: 01/02/1961           MRN: [id number]             PCP: Audley Hose, MD Referring: Audley Hose, MD Visit Date: 02/17/2022 Occupation: '@GUAROCC'$ @  Subjective:  No chief complaint on file.   History of Present Illness: Veronica Booth is a 61 y.o. female ***   Activities of Daily Living:  Patient reports morning stiffness for *** {minute/hour:19697}.   Patient {ACTIONS;DENIES/REPORTS:21021675::"Denies"} nocturnal pain.  Difficulty dressing/grooming: {ACTIONS;DENIES/REPORTS:21021675::"Denies"} Difficulty climbing stairs: {ACTIONS;DENIES/REPORTS:21021675::"Denies"} Difficulty getting out of chair: {ACTIONS;DENIES/REPORTS:21021675::"Denies"} Difficulty using hands for taps, buttons, cutlery, and/or writing: {ACTIONS;DENIES/REPORTS:21021675::"Denies"}  No Rheumatology ROS completed.   PMFS History:  Patient Active Problem List   Diagnosis Date Noted  . SOB (shortness of breath) 04/29/2019  . Chest pain of uncertain etiology 78/24/2353  . Nonallopathic lesion of cervical region 08/04/2016  . Nonallopathic lesion of thoracic region 08/04/2016  . Nonallopathic lesion of sacral region 08/04/2016  . Nonallopathic lesion of lumbosacral region 08/04/2016  . Trigger point of left shoulder region 07/14/2016  . Poor posture 07/14/2016  . Cervical disc disorder with radiculopathy of cervical region 07/14/2016  . Cervical pain (neck) 03/31/2016  . Right knee pain 03/31/2016    Past Medical History:  Diagnosis Date  . Body mass index (bmi) 25.0-25.9, adult   . Chest pain   . Cough   . Deep dyspareunia   . DJD (degenerative joint disease) of cervical spine   . Eczema   . Elevated blood pressure reading without diagnosis of hypertension   . Heart palpitations   . History of tobacco abuse   . Insomnia   . Light headedness   . Low vitamin D level   . Right hand paresthesia   . Urinary  frequency   . Urticaria     Family History  Problem Relation Age of Onset  . Other Mother        ACCIDENTALLY  . High Cholesterol Father   . Healthy Sister   . Healthy Sister   . Healthy Sister   . Healthy Sister   . Cancer Maternal Aunt        BREAST CANCER  . Canavan disease Paternal Aunt   . Cancer Paternal Aunt        BREAST CANCER  . Hodgkin's lymphoma Child    Past Surgical History:  Procedure Laterality Date  . CESAREAN SECTION    . TONSILLECTOMY     61 years of age   Social History   Social History Narrative  . Not on file    There is no immunization history on file for this patient.   Objective: Vital Signs: LMP 08/02/2016    Physical Exam   Musculoskeletal Exam: ***  CDAI Exam: CDAI Score: -- Patient Global: --; Provider Global: -- Swollen: --; Tender: -- Joint Exam 02/17/2022   No joint exam has been documented for this visit   There is currently no information documented on the homunculus. Go to the Rheumatology activity and complete the homunculus joint exam.  Investigation: No additional findings.  Imaging: No results found.  Recent Labs: Lab Results  Component Value Date   WBC 5.4 10/09/2019   HGB 13.5 10/09/2019   PLT 233 10/09/2019   NA 141 10/09/2019   K 4.6 10/09/2019   CL 104 10/09/2019   CO2 24  10/09/2019   GLUCOSE 86 10/09/2019   BUN 15 10/09/2019   CREATININE 0.72 10/09/2019   BILITOT 0.3 10/09/2019   ALKPHOS 177 (H) 10/09/2019   AST 31 10/09/2019   ALT 54 (H) 10/09/2019   PROT 7.2 10/09/2019   ALBUMIN 4.5 10/09/2019   CALCIUM 9.5 10/09/2019   GFRAA 106 10/09/2019    Speciality Comments: No specialty comments available.  Procedures:  No procedures performed Allergies: Patient has no known allergies.   Assessment / Plan:     Visit Diagnoses: No diagnosis found.  Orders: No orders of the defined types were placed in this encounter.  No orders of the defined types were placed in this  encounter.   Face-to-face time spent with patient was *** minutes. Greater than 50% of time was spent in counseling and coordination of care.  Follow-Up Instructions: No follow-ups on file.   Bo Merino, MD  Note - This record has been created using Editor, commissioning.  Chart creation errors have been sought, but may not always  have been located. Such creation errors do not reflect on  the standard of medical care.

## 2022-02-17 ENCOUNTER — Ambulatory Visit: Payer: BC Managed Care – PPO | Admitting: Rheumatology

## 2022-02-17 DIAGNOSIS — M818 Other osteoporosis without current pathological fracture: Secondary | ICD-10-CM

## 2022-02-17 DIAGNOSIS — M19041 Primary osteoarthritis, right hand: Secondary | ICD-10-CM

## 2022-02-17 DIAGNOSIS — Z8719 Personal history of other diseases of the digestive system: Secondary | ICD-10-CM

## 2022-02-17 DIAGNOSIS — Z79899 Other long term (current) drug therapy: Secondary | ICD-10-CM

## 2022-02-17 DIAGNOSIS — Z8659 Personal history of other mental and behavioral disorders: Secondary | ICD-10-CM

## 2022-02-17 DIAGNOSIS — E041 Nontoxic single thyroid nodule: Secondary | ICD-10-CM

## 2022-02-17 DIAGNOSIS — G4709 Other insomnia: Secondary | ICD-10-CM

## 2022-02-17 DIAGNOSIS — Z87442 Personal history of urinary calculi: Secondary | ICD-10-CM

## 2022-02-17 DIAGNOSIS — E559 Vitamin D deficiency, unspecified: Secondary | ICD-10-CM

## 2022-02-17 DIAGNOSIS — K760 Fatty (change of) liver, not elsewhere classified: Secondary | ICD-10-CM

## 2022-02-17 DIAGNOSIS — I10 Essential (primary) hypertension: Secondary | ICD-10-CM

## 2022-02-17 DIAGNOSIS — R0602 Shortness of breath: Secondary | ICD-10-CM

## 2022-02-17 DIAGNOSIS — M999 Biomechanical lesion, unspecified: Secondary | ICD-10-CM

## 2022-02-17 DIAGNOSIS — M5030 Other cervical disc degeneration, unspecified cervical region: Secondary | ICD-10-CM

## 2022-02-22 NOTE — Progress Notes (Signed)
DEXA scan from January 2023 showed T score of -2.2.  Which is in the osteopenia range.  I do not have previous DEXA scan results for comparison.  It is okay for patient to discontinue Fosamax as discussed during the last office visit.  She should have repeat DEXA scan in 2 years.

## 2022-04-20 DIAGNOSIS — H1032 Unspecified acute conjunctivitis, left eye: Secondary | ICD-10-CM | POA: Diagnosis not present

## 2022-05-23 DIAGNOSIS — E785 Hyperlipidemia, unspecified: Secondary | ICD-10-CM | POA: Diagnosis not present

## 2022-05-23 DIAGNOSIS — Z136 Encounter for screening for cardiovascular disorders: Secondary | ICD-10-CM | POA: Diagnosis not present

## 2022-05-23 DIAGNOSIS — Z1159 Encounter for screening for other viral diseases: Secondary | ICD-10-CM | POA: Diagnosis not present

## 2022-05-24 DIAGNOSIS — E785 Hyperlipidemia, unspecified: Secondary | ICD-10-CM | POA: Diagnosis not present

## 2022-05-24 DIAGNOSIS — H01139 Eczematous dermatitis of unspecified eye, unspecified eyelid: Secondary | ICD-10-CM | POA: Diagnosis not present

## 2022-05-24 DIAGNOSIS — F320 Major depressive disorder, single episode, mild: Secondary | ICD-10-CM | POA: Diagnosis not present

## 2022-05-24 DIAGNOSIS — G4701 Insomnia due to medical condition: Secondary | ICD-10-CM | POA: Diagnosis not present

## 2022-05-24 DIAGNOSIS — E663 Overweight: Secondary | ICD-10-CM | POA: Diagnosis not present

## 2022-05-25 ENCOUNTER — Other Ambulatory Visit: Payer: Self-pay | Admitting: Internal Medicine

## 2022-05-25 DIAGNOSIS — E041 Nontoxic single thyroid nodule: Secondary | ICD-10-CM

## 2022-07-03 ENCOUNTER — Ambulatory Visit
Admission: RE | Admit: 2022-07-03 | Discharge: 2022-07-03 | Disposition: A | Discharge status: Home / Self Care | Payer: BC Managed Care – PPO | Source: Ambulatory Visit | Attending: Internal Medicine | Admitting: Internal Medicine

## 2022-07-03 DIAGNOSIS — E041 Nontoxic single thyroid nodule: Secondary | ICD-10-CM

## 2022-07-06 NOTE — Progress Notes (Deleted)
Office Visit Note  Patient: Veronica Booth             Date of Birth: [birth year]           MRN: [id number]             PCP: Audley Hose, MD Referring: Audley Hose, MD Visit Date: 07/17/2022 Occupation: '@GUAROCC'$ @  Subjective:  No chief complaint on file.   History of Present Illness: Veronica Booth is a 61 y.o. female ***   Activities of Daily Living:  Patient reports morning stiffness for *** {minute/hour:19697}.   Patient {ACTIONS;DENIES/REPORTS:21021675::"Denies"} nocturnal pain.  Difficulty dressing/grooming: {ACTIONS;DENIES/REPORTS:21021675::"Denies"} Difficulty climbing stairs: {ACTIONS;DENIES/REPORTS:21021675::"Denies"} Difficulty getting out of chair: {ACTIONS;DENIES/REPORTS:21021675::"Denies"} Difficulty using hands for taps, buttons, cutlery, and/or writing: {ACTIONS;DENIES/REPORTS:21021675::"Denies"}  No Rheumatology ROS completed.   PMFS History:  Patient Active Problem List   Diagnosis Date Noted   SOB (shortness of breath) 04/29/2019   Chest pain of uncertain etiology 95/62/1308   Nonallopathic lesion of cervical region 08/04/2016   Nonallopathic lesion of thoracic region 08/04/2016   Nonallopathic lesion of sacral region 08/04/2016   Nonallopathic lesion of lumbosacral region 08/04/2016   Trigger point of left shoulder region 07/14/2016   Poor posture 07/14/2016   Cervical disc disorder with radiculopathy of cervical region 07/14/2016   Cervical pain (neck) 03/31/2016   Right knee pain 03/31/2016    Past Medical History:  Diagnosis Date   Body mass index (bmi) 25.0-25.9, adult    Chest pain    Cough    Deep dyspareunia    DJD (degenerative joint disease) of cervical spine    Eczema    Elevated blood pressure reading without diagnosis of hypertension    Heart palpitations    History of tobacco abuse    Insomnia    Light headedness    Low vitamin D level    Right hand paresthesia    Urinary frequency    Urticaria      Family History  Problem Relation Age of Onset   Other Mother        ACCIDENTALLY   High Cholesterol Father    Healthy Sister    Healthy Sister    Healthy Sister    Healthy Sister    Cancer Maternal Aunt        BREAST CANCER   Canavan disease Paternal Aunt    Cancer Paternal Aunt        BREAST CANCER   Hodgkin's lymphoma Child    Past Surgical History:  Procedure Laterality Date   CESAREAN SECTION     TONSILLECTOMY     62 years of age   Social History   Social History Narrative   Not on file    There is no immunization history on file for this patient.   Objective: Vital Signs: LMP 08/02/2016    Physical Exam   Musculoskeletal Exam: ***  CDAI Exam: CDAI Score: -- Patient Global: --; Provider Global: -- Swollen: --; Tender: -- Joint Exam 07/17/2022   No joint exam has been documented for this visit   There is currently no information documented on the homunculus. Go to the Rheumatology activity and complete the homunculus joint exam.  Investigation: No additional findings.  Imaging: US THYROID  Result Date: 07/03/2022 CLINICAL DATA:  Goiter. Patient previously underwent biopsy of left inferior thyroid nodule in November of 2022. EXAM: THYROID ULTRASOUND TECHNIQUE: Ultrasound examination of the thyroid gland and adjacent soft tissues was performed. COMPARISON:  Prior thyroid ultrasound 08/09/2021 FINDINGS:  Parenchymal Echotexture: Mildly heterogenous Isthmus: 0.2 cm Right lobe: 3.8 x 1.7 x 1.7 cm Left lobe: 4.2 x 2.0 x 1.9 cm _________________________________________________________ Estimated total number of nodules >/= 1 cm: 1 Number of spongiform nodules >/=  2 cm not described below (TR1): 0 Number of mixed cystic and solid nodules >/= 1.5 cm not described below (TR2): 0 _________________________________________________________ Nodule # 2: Stable size of previously biopsied nodule in the left inferior gland at 1.0 x 0.8 x 0.8 cm. Of note, a nodule is measured  more precisely on the current examination and does not demonstrate taller than wide features. No new nodules or suspicious features. IMPRESSION: Stable previously biopsied nodule in the left lower lobe. Presuming a benign biopsy result, no further follow-up is recommended. The above is in keeping with the ACR TI-RADS recommendations - J Am Coll Radiol 2017;14:587-595. Electronically Signed   By: Jacqulynn Cadet M.D.   On: 07/03/2022 15:59    Recent Labs: Lab Results  Component Value Date   WBC 5.4 10/09/2019   HGB 13.5 10/09/2019   PLT 233 10/09/2019   NA 141 10/09/2019   K 4.6 10/09/2019   CL 104 10/09/2019   CO2 24 10/09/2019   GLUCOSE 86 10/09/2019   BUN 15 10/09/2019   CREATININE 0.72 10/09/2019   BILITOT 0.3 10/09/2019   ALKPHOS 177 (H) 10/09/2019   AST 31 10/09/2019   ALT 54 (H) 10/09/2019   PROT 7.2 10/09/2019   ALBUMIN 4.5 10/09/2019   CALCIUM 9.5 10/09/2019   GFRAA 106 10/09/2019      Speciality Comments: Alendronate X 4 years, discontinued March 2023 due to dysphagia  Procedures:  No procedures performed Allergies: Patient has no known allergies.   Assessment / Plan:     Visit Diagnoses: No diagnosis found.  Orders: No orders of the defined types were placed in this encounter.  No orders of the defined types were placed in this encounter.   Face-to-face time spent with patient was *** minutes. Greater than 50% of time was spent in counseling and coordination of care.  Follow-Up Instructions: No follow-ups on file.   Bo Merino, MD  Note - This record has been created using Editor, commissioning.  Chart creation errors have been sought, but may not always  have been located. Such creation errors do not reflect on  the standard of medical care.

## 2022-07-10 ENCOUNTER — Telehealth: Payer: Self-pay | Admitting: Rheumatology

## 2022-07-17 ENCOUNTER — Ambulatory Visit: Payer: BC Managed Care – PPO | Admitting: Rheumatology

## 2022-07-17 DIAGNOSIS — M5010 Cervical disc disorder with radiculopathy, unspecified cervical region: Secondary | ICD-10-CM

## 2022-07-17 DIAGNOSIS — G4709 Other insomnia: Secondary | ICD-10-CM

## 2022-07-17 DIAGNOSIS — E559 Vitamin D deficiency, unspecified: Secondary | ICD-10-CM

## 2022-07-17 DIAGNOSIS — Z87442 Personal history of urinary calculi: Secondary | ICD-10-CM

## 2022-07-17 DIAGNOSIS — I10 Essential (primary) hypertension: Secondary | ICD-10-CM

## 2022-07-17 DIAGNOSIS — R0602 Shortness of breath: Secondary | ICD-10-CM

## 2022-07-17 DIAGNOSIS — M999 Biomechanical lesion, unspecified: Secondary | ICD-10-CM

## 2022-07-17 DIAGNOSIS — K760 Fatty (change of) liver, not elsewhere classified: Secondary | ICD-10-CM

## 2022-07-17 DIAGNOSIS — E041 Nontoxic single thyroid nodule: Secondary | ICD-10-CM

## 2022-07-17 DIAGNOSIS — M19041 Primary osteoarthritis, right hand: Secondary | ICD-10-CM

## 2022-07-17 DIAGNOSIS — Z8659 Personal history of other mental and behavioral disorders: Secondary | ICD-10-CM

## 2022-07-17 DIAGNOSIS — M818 Other osteoporosis without current pathological fracture: Secondary | ICD-10-CM

## 2022-07-17 DIAGNOSIS — Z8719 Personal history of other diseases of the digestive system: Secondary | ICD-10-CM

## 2022-07-17 NOTE — Telephone Encounter (Signed)
Opened in error

## 2022-07-28 DIAGNOSIS — R635 Abnormal weight gain: Secondary | ICD-10-CM | POA: Diagnosis not present

## 2022-07-28 DIAGNOSIS — K5901 Slow transit constipation: Secondary | ICD-10-CM | POA: Diagnosis not present

## 2022-07-28 DIAGNOSIS — G4700 Insomnia, unspecified: Secondary | ICD-10-CM | POA: Diagnosis not present

## 2022-07-28 DIAGNOSIS — E663 Overweight: Secondary | ICD-10-CM | POA: Diagnosis not present

## 2022-07-28 DIAGNOSIS — E041 Nontoxic single thyroid nodule: Secondary | ICD-10-CM | POA: Diagnosis not present

## 2022-07-28 LAB — COMPREHENSIVE METABOLIC PANEL WITH GFR
Albumin: 4.2 (ref 3.5–5.0)
Calcium: 9.3 (ref 8.7–10.7)
eGFR: 93

## 2022-07-28 LAB — CBC AND DIFFERENTIAL
HCT: 39 (ref 36–46)
Hemoglobin: 13 (ref 12.0–16.0)
Platelets: 206 10*3/uL (ref 150–400)
WBC: 4.6

## 2022-07-28 LAB — CBC: RBC: 4.44 (ref 3.87–5.11)

## 2022-07-28 LAB — BASIC METABOLIC PANEL WITH GFR
CO2: 29 — AB (ref 13–22)
Chloride: 107 (ref 99–108)
Potassium: 4.4 meq/L (ref 3.5–5.1)
Sodium: 141 (ref 137–147)

## 2022-07-28 LAB — HEPATIC FUNCTION PANEL
ALT: 25 U/L (ref 7–35)
AST: 17 (ref 13–35)
Alkaline Phosphatase: 109 (ref 25–125)
Bilirubin, Total: 0.5

## 2022-07-28 LAB — HEMOGLOBIN A1C: Hemoglobin A1C: 5.1

## 2022-07-28 LAB — TSH: TSH: 1.76 (ref 0.41–5.90)

## 2022-09-19 DIAGNOSIS — J111 Influenza due to unidentified influenza virus with other respiratory manifestations: Secondary | ICD-10-CM | POA: Diagnosis not present

## 2022-09-19 DIAGNOSIS — J209 Acute bronchitis, unspecified: Secondary | ICD-10-CM | POA: Diagnosis not present

## 2022-11-28 DIAGNOSIS — Z803 Family history of malignant neoplasm of breast: Secondary | ICD-10-CM | POA: Diagnosis not present

## 2022-11-28 DIAGNOSIS — Z806 Family history of leukemia: Secondary | ICD-10-CM | POA: Diagnosis not present

## 2022-11-28 DIAGNOSIS — Z01419 Encounter for gynecological examination (general) (routine) without abnormal findings: Secondary | ICD-10-CM | POA: Diagnosis not present

## 2022-11-28 DIAGNOSIS — Z6828 Body mass index (BMI) 28.0-28.9, adult: Secondary | ICD-10-CM | POA: Diagnosis not present

## 2022-11-28 DIAGNOSIS — Z1231 Encounter for screening mammogram for malignant neoplasm of breast: Secondary | ICD-10-CM | POA: Diagnosis not present

## 2022-11-28 DIAGNOSIS — Z86010 Personal history of colonic polyps: Secondary | ICD-10-CM | POA: Diagnosis not present

## 2022-11-28 DIAGNOSIS — Z8041 Family history of malignant neoplasm of ovary: Secondary | ICD-10-CM | POA: Diagnosis not present

## 2023-01-08 DIAGNOSIS — Z809 Family history of malignant neoplasm, unspecified: Secondary | ICD-10-CM | POA: Diagnosis not present

## 2023-01-08 DIAGNOSIS — Z803 Family history of malignant neoplasm of breast: Secondary | ICD-10-CM | POA: Diagnosis not present

## 2023-01-08 DIAGNOSIS — Z8041 Family history of malignant neoplasm of ovary: Secondary | ICD-10-CM | POA: Diagnosis not present

## 2023-10-08 DIAGNOSIS — R0782 Intercostal pain: Secondary | ICD-10-CM | POA: Diagnosis not present

## 2023-10-08 DIAGNOSIS — R053 Chronic cough: Secondary | ICD-10-CM | POA: Diagnosis not present

## 2023-10-24 DIAGNOSIS — H5213 Myopia, bilateral: Secondary | ICD-10-CM | POA: Diagnosis not present

## 2023-10-24 DIAGNOSIS — H524 Presbyopia: Secondary | ICD-10-CM | POA: Diagnosis not present

## 2023-10-24 DIAGNOSIS — H52223 Regular astigmatism, bilateral: Secondary | ICD-10-CM | POA: Diagnosis not present

## 2023-11-07 DIAGNOSIS — R0981 Nasal congestion: Secondary | ICD-10-CM | POA: Diagnosis not present

## 2023-12-13 DIAGNOSIS — Z01419 Encounter for gynecological examination (general) (routine) without abnormal findings: Secondary | ICD-10-CM | POA: Diagnosis not present

## 2023-12-13 DIAGNOSIS — Z124 Encounter for screening for malignant neoplasm of cervix: Secondary | ICD-10-CM | POA: Diagnosis not present

## 2023-12-13 DIAGNOSIS — E559 Vitamin D deficiency, unspecified: Secondary | ICD-10-CM | POA: Diagnosis not present

## 2023-12-13 DIAGNOSIS — Z1231 Encounter for screening mammogram for malignant neoplasm of breast: Secondary | ICD-10-CM | POA: Diagnosis not present

## 2023-12-13 DIAGNOSIS — Z1151 Encounter for screening for human papillomavirus (HPV): Secondary | ICD-10-CM | POA: Diagnosis not present

## 2023-12-13 DIAGNOSIS — Z6828 Body mass index (BMI) 28.0-28.9, adult: Secondary | ICD-10-CM | POA: Diagnosis not present

## 2023-12-13 DIAGNOSIS — R5383 Other fatigue: Secondary | ICD-10-CM | POA: Diagnosis not present

## 2023-12-13 DIAGNOSIS — R61 Generalized hyperhidrosis: Secondary | ICD-10-CM | POA: Diagnosis not present

## 2023-12-13 LAB — HM MAMMOGRAPHY

## 2023-12-13 LAB — RESULTS CONSOLE HPV: CHL HPV: NEGATIVE

## 2023-12-13 LAB — HM PAP SMEAR: HM Pap smear: NEGATIVE

## 2023-12-17 ENCOUNTER — Ambulatory Visit: Payer: BC Managed Care – PPO | Admitting: Nurse Practitioner

## 2024-01-17 ENCOUNTER — Ambulatory Visit: Admitting: Nurse Practitioner

## 2024-01-23 DIAGNOSIS — Z13820 Encounter for screening for osteoporosis: Secondary | ICD-10-CM | POA: Diagnosis not present

## 2024-01-23 LAB — HM DEXA SCAN

## 2024-03-03 ENCOUNTER — Encounter: Payer: Self-pay | Admitting: Nurse Practitioner

## 2024-03-03 ENCOUNTER — Ambulatory Visit: Admitting: Nurse Practitioner

## 2024-03-03 VITALS — BP 120/80 | HR 61 | Temp 97.1°F | Ht 61.0 in | Wt 143.2 lb

## 2024-03-03 DIAGNOSIS — L309 Dermatitis, unspecified: Secondary | ICD-10-CM | POA: Diagnosis not present

## 2024-03-03 DIAGNOSIS — E663 Overweight: Secondary | ICD-10-CM | POA: Insufficient documentation

## 2024-03-03 DIAGNOSIS — J40 Bronchitis, not specified as acute or chronic: Secondary | ICD-10-CM | POA: Diagnosis not present

## 2024-03-03 DIAGNOSIS — G4700 Insomnia, unspecified: Secondary | ICD-10-CM | POA: Insufficient documentation

## 2024-03-03 DIAGNOSIS — F5101 Primary insomnia: Secondary | ICD-10-CM

## 2024-03-03 DIAGNOSIS — M8580 Other specified disorders of bone density and structure, unspecified site: Secondary | ICD-10-CM | POA: Insufficient documentation

## 2024-03-03 MED ORDER — ALBUTEROL SULFATE HFA 108 (90 BASE) MCG/ACT IN AERS: 1.0000 | INHALATION_SPRAY | Freq: Four times a day (QID) | RESPIRATORY_TRACT | 2 refills | Status: AC | PRN

## 2024-03-03 MED ORDER — PREDNISONE 20 MG PO TABS: 40.0000 mg | ORAL_TABLET | Freq: Every day | ORAL | 0 refills | Status: AC

## 2024-03-03 MED ORDER — TRIAMCINOLONE ACETONIDE 0.1 % EX CREA: 1.0000 | TOPICAL_CREAM | Freq: Two times a day (BID) | CUTANEOUS | 0 refills | Status: AC

## 2024-03-03 NOTE — Patient Instructions (Addendum)
 It was great to see you!  Start prednisone  2 tablets daily with food for 5 days   Start albuterol  inhaler every 6 hours as needed for shortness of breath  Start triamcinolone  cream twice a day to your eczema   Let's follow-up in 8 months, sooner if you have concerns.  If a referral was placed today, you will be contacted for an appointment. Please note that routine referrals can sometimes take up to 3-4 weeks to process. Please call our office if you haven't heard anything after this time frame.  Take care,  Rheba Cedar, NP

## 2024-03-03 NOTE — Assessment & Plan Note (Signed)
 She experiences chronic eczema post-menopause with insufficient relief from over-the-counter hydrocortisone. Prescribe triamcinolone  cream to apply twice daily as needed to affected areas.

## 2024-03-03 NOTE — Assessment & Plan Note (Signed)
 BMI 27. Discussed nutrition, exercise.

## 2024-03-03 NOTE — Assessment & Plan Note (Signed)
 Chronic insomnia is managed with trazodone 100mg  daily. Discussed weight gain is not common with trazodone, but can happen. If she is interested we can wean her off the trazodone.

## 2024-03-03 NOTE — Progress Notes (Signed)
 New Patient Visit  BP 120/80 (BP Location: Left Arm, Patient Position: Sitting, Cuff Size: Normal)   Pulse 61   Temp (!) 97.1 F (36.2 C) (Temporal)   Ht 5\' 1"  (1.549 m)   Wt 143 lb 3.2 oz (65 kg)   LMP 08/02/2016   SpO2 95%   BMI 27.06 kg/m    Subjective:    Patient ID: Veronica Booth, female    DOB: Aug 16, 1961, 63 y.o.   MRN: [id number]  CC: Chief Complaint  Patient presents with   Establish Care    For about a month patient has had a cough has been taking otc meds for a month but still is there also needs something for eczema     HPI: Veronica Booth is a 63 y.o. female presents for new patient visit to establish care.  Introduced to Publishing rights manager role and practice setting.  All questions answered.  Discussed provider/patient relationship and expectations.  Discussed the use of AI scribe software for clinical note transcription with the patient, who gave verbal consent to proceed.  History of Present Illness   Veronica Booth is a 63 year old female who presents with a persistent cough and eczema.  She has experienced a persistent cough for one month, which is intermittent and more frequent at night, with yellow phlegm production. She has a history of pneumonia and bronchitis, often starting with a sinus infection. She uses Mucinex as needed and takes Zyrtec daily without significant relief. She denies fever, runny nose, ear pain, or chest pain but has chest tightness and shortness of breath. She does not have an inhaler.  Eczema has been present for most of her life, worsening with menopause. It is primarily on her hands, with itchy water blisters. Over-the-counter hydrocortisone cream provides temporary relief but is insufficient to clear the condition.  She has insomnia, managed with trazodone 100 mg at bedtime, and experiences difficulty with weight management, attributed to trazodone and menopause. Osteopenia affects her neck, managed with  calcium and vitamin D supplements.     Depression and Anxiety Screen done:     03/03/2024    3:17 PM  Depression screen PHQ 2/9  Decreased Interest 0  Down, Depressed, Hopeless 0  PHQ - 2 Score 0  Altered sleeping 3  Tired, decreased energy 3  Change in appetite 0  Feeling bad or failure about yourself  0  Trouble concentrating 0  Moving slowly or fidgety/restless 0  Suicidal thoughts 0  PHQ-9 Score 6  Difficult doing work/chores Somewhat difficult      03/03/2024    3:17 PM  GAD 7 : Generalized Anxiety Score  Nervous, Anxious, on Edge 1  Control/stop worrying 0  Worry too much - different things 0  Trouble relaxing 0  Restless 0  Easily annoyed or irritable 0  Afraid - awful might happen 0  Total GAD 7 Score 1  Anxiety Difficulty Somewhat difficult   Past Medical History:  Diagnosis Date   Chest pain    Cough    Deep dyspareunia    DJD (degenerative joint disease) of cervical spine    Eczema    Elevated blood pressure reading without diagnosis of hypertension    Heart palpitations    History of tobacco abuse    Insomnia    Light headedness    Low vitamin D level    Osteopenia    Right hand paresthesia    Urinary frequency    Urticaria  Past Surgical History:  Procedure Laterality Date   CESAREAN SECTION     TONSILLECTOMY     63 years of age    Family History  Problem Relation Age of Onset   Other Mother        ACCIDENTALLY   High Cholesterol Father    Healthy Sister    Healthy Sister    Healthy Sister    Healthy Sister    Cancer Maternal Aunt        BREAST CANCER   Canavan disease Paternal Aunt    Cancer Paternal Aunt        BREAST CANCER   Hodgkin's lymphoma Child      Social History   Tobacco Use   Smoking status: Former    Current packs/day: 0.00    Types: Cigarettes    Quit date: 10/02/2008    Years since quitting: 15.4    Passive exposure: Never   Smokeless tobacco: Never  Vaping Use   Vaping status: Never Used  Substance Use  Topics   Alcohol use: No    Alcohol/week: 0.0 standard drinks of alcohol   Drug use: No    Current Outpatient Medications on File Prior to Visit  Medication Sig Dispense Refill   ALPRAZolam (XANAX) 0.25 MG tablet as needed.     CALCIUM PO Take by mouth daily.     Cetirizine-Pseudoephedrine (ZYRTEC-D PO) Take by mouth.     Cholecalciferol (VITAMIN D3 PO) Take 5,000 Units by mouth daily.     traZODone (DESYREL) 100 MG tablet Take 100 mg by mouth at bedtime.     fluticasone (FLOVENT  HFA) 110 MCG/ACT inhaler Inhale 2 puffs into the lungs 2 (two) times daily. (Patient not taking: Reported on 03/03/2024) 1 Inhaler 5   No current facility-administered medications on file prior to visit.     Review of Systems  Constitutional: Negative.   HENT: Negative.    Respiratory:  Positive for cough, chest tightness and shortness of breath.   Cardiovascular: Negative.   Gastrointestinal: Negative.   Genitourinary: Negative.   Musculoskeletal: Negative.   Skin:  Positive for rash.  Neurological: Negative.   Psychiatric/Behavioral: Negative.          Objective:     BP 120/80 (BP Location: Left Arm, Patient Position: Sitting, Cuff Size: Normal)   Pulse 61   Temp (!) 97.1 F (36.2 C) (Temporal)   Ht 5\' 1"  (1.549 m)   Wt 143 lb 3.2 oz (65 kg)   LMP 08/02/2016   SpO2 95%   BMI 27.06 kg/m   Wt Readings from Last 3 Encounters:  03/03/24 143 lb 3.2 oz (65 kg)  01/03/22 148 lb 6.4 oz (67.3 kg)  10/09/19 142 lb 12.8 oz (64.8 kg)    BP Readings from Last 3 Encounters:  03/03/24 120/80  01/03/22 119/79  02/02/21 (!) 132/91    Physical Exam Vitals and nursing note reviewed.  Constitutional:      General: She is not in acute distress.    Appearance: Normal appearance.  HENT:     Head: Normocephalic and atraumatic.     Right Ear: Tympanic membrane, ear canal and external ear normal.     Left Ear: Tympanic membrane, ear canal and external ear normal.     Mouth/Throat:     Mouth: Mucous  membranes are moist.     Pharynx: No posterior oropharyngeal erythema.  Eyes:     Conjunctiva/sclera: Conjunctivae normal.  Cardiovascular:     Rate and Rhythm: Normal  rate and regular rhythm.     Pulses: Normal pulses.     Heart sounds: Normal heart sounds.  Pulmonary:     Effort: Pulmonary effort is normal.     Breath sounds: Normal breath sounds.     Comments: Coughing during visit Abdominal:     Palpations: Abdomen is soft.     Tenderness: There is no abdominal tenderness.  Musculoskeletal:        General: Normal range of motion.     Cervical back: Normal range of motion and neck supple.     Right lower leg: No edema.     Left lower leg: No edema.  Lymphadenopathy:     Cervical: No cervical adenopathy.  Skin:    General: Skin is warm and dry.  Neurological:     General: No focal deficit present.     Mental Status: She is alert and oriented to person, place, and time.     Cranial Nerves: No cranial nerve deficit.     Coordination: Coordination normal.     Gait: Gait normal.  Psychiatric:        Mood and Affect: Mood normal.        Behavior: Behavior normal.        Thought Content: Thought content normal.        Judgment: Judgment normal.        Assessment & Plan:   Problem List Items Addressed This Visit       Musculoskeletal and Integument   Eczema   She experiences chronic eczema post-menopause with insufficient relief from over-the-counter hydrocortisone. Prescribe triamcinolone  cream to apply twice daily as needed to affected areas.      Osteopenia   She is currently taking a calcium and vitamin D supplement daily. Will request dexa scan results from GYN.         Other   Insomnia   Chronic insomnia is managed with trazodone 100mg  daily. Discussed weight gain is not common with trazodone, but can happen. If she is interested we can wean her off the trazodone.       Overweight (BMI 25.0-29.9)   BMI 27. Discussed nutrition, exercise.       Other  Visit Diagnoses       Bronchitis    -  Primary   Start prednisone , 40mg  daily with food for 5 days. Prescribe an albuterol  inhaler every 6 hours as needed for dyspnea. Continue Zyrtec daily        Follow up plan: Return in about 8 months (around 11/03/2024) for CPE.  Elias Dennington A Mahki Spikes

## 2024-03-03 NOTE — Assessment & Plan Note (Signed)
 She is currently taking a calcium and vitamin D supplement daily. Will request dexa scan results from GYN.

## 2024-03-04 ENCOUNTER — Encounter: Payer: Self-pay | Admitting: Nurse Practitioner

## 2024-03-21 ENCOUNTER — Encounter: Payer: Self-pay | Admitting: Nurse Practitioner

## 2024-06-12 DIAGNOSIS — E559 Vitamin D deficiency, unspecified: Secondary | ICD-10-CM | POA: Diagnosis not present

## 2024-06-12 DIAGNOSIS — J4520 Mild intermittent asthma, uncomplicated: Secondary | ICD-10-CM | POA: Diagnosis not present

## 2024-06-12 DIAGNOSIS — Z86018 Personal history of other benign neoplasm: Secondary | ICD-10-CM | POA: Diagnosis not present

## 2024-06-12 DIAGNOSIS — M8580 Other specified disorders of bone density and structure, unspecified site: Secondary | ICD-10-CM | POA: Diagnosis not present

## 2024-06-12 DIAGNOSIS — Z0000 Encounter for general adult medical examination without abnormal findings: Secondary | ICD-10-CM | POA: Diagnosis not present

## 2024-06-12 DIAGNOSIS — F411 Generalized anxiety disorder: Secondary | ICD-10-CM | POA: Diagnosis not present

## 2024-06-12 DIAGNOSIS — Z23 Encounter for immunization: Secondary | ICD-10-CM | POA: Diagnosis not present

## 2024-06-12 DIAGNOSIS — G4700 Insomnia, unspecified: Secondary | ICD-10-CM | POA: Diagnosis not present

## 2024-06-19 DIAGNOSIS — Z860100 Personal history of colon polyps, unspecified: Secondary | ICD-10-CM | POA: Diagnosis not present

## 2024-06-19 DIAGNOSIS — Z1211 Encounter for screening for malignant neoplasm of colon: Secondary | ICD-10-CM | POA: Diagnosis not present

## 2024-06-19 DIAGNOSIS — K581 Irritable bowel syndrome with constipation: Secondary | ICD-10-CM | POA: Diagnosis not present

## 2024-06-19 DIAGNOSIS — K760 Fatty (change of) liver, not elsewhere classified: Secondary | ICD-10-CM | POA: Diagnosis not present

## 2024-07-21 DIAGNOSIS — K5730 Diverticulosis of large intestine without perforation or abscess without bleeding: Secondary | ICD-10-CM | POA: Diagnosis not present

## 2024-07-21 DIAGNOSIS — Z860100 Personal history of colon polyps, unspecified: Secondary | ICD-10-CM | POA: Diagnosis not present

## 2024-07-21 DIAGNOSIS — Z1211 Encounter for screening for malignant neoplasm of colon: Secondary | ICD-10-CM | POA: Diagnosis not present

## 2024-07-21 DIAGNOSIS — K648 Other hemorrhoids: Secondary | ICD-10-CM | POA: Diagnosis not present

## 2024-11-03 ENCOUNTER — Encounter: Payer: Self-pay | Admitting: Nurse Practitioner
